# Patient Record
Sex: Female | Born: 1980 | Race: Black or African American | Hispanic: No | Marital: Single | Smoking: Current every day smoker
Health system: Southern US, Community
[De-identification: ages and names within clinical notes are randomized; demographics above are authoritative.]

## PROBLEM LIST (undated history)

## (undated) DIAGNOSIS — N879 Dysplasia of cervix uteri, unspecified: Secondary | ICD-10-CM

## (undated) HISTORY — DX: Dysplasia of cervix uteri, unspecified: N87.9

## (undated) HISTORY — PX: BREAST SURGERY: SHX581

## (undated) HISTORY — PX: COLPOSCOPY: SHX161

---

## 2003-11-12 ENCOUNTER — Other Ambulatory Visit: Admission: RE | Admit: 2003-11-12 | Discharge: 2003-11-12 | Payer: Self-pay | Admitting: *Deleted

## 2006-05-22 ENCOUNTER — Emergency Department (HOSPITAL_COMMUNITY): Admission: EM | Admit: 2006-05-22 | Discharge: 2006-05-22 | Payer: Self-pay | Admitting: Emergency Medicine

## 2006-05-27 ENCOUNTER — Inpatient Hospital Stay (HOSPITAL_COMMUNITY): Admission: AD | Admit: 2006-05-27 | Discharge: 2006-05-27 | Payer: Self-pay | Admitting: Gynecology

## 2006-08-16 ENCOUNTER — Emergency Department (HOSPITAL_COMMUNITY): Admission: EM | Admit: 2006-08-16 | Discharge: 2006-08-16 | Payer: Self-pay | Admitting: Emergency Medicine

## 2007-08-16 ENCOUNTER — Other Ambulatory Visit: Admission: RE | Admit: 2007-08-16 | Discharge: 2007-08-16 | Payer: Self-pay | Admitting: Gynecology

## 2010-10-11 ENCOUNTER — Ambulatory Visit (INDEPENDENT_AMBULATORY_CARE_PROVIDER_SITE_OTHER): Payer: BC Managed Care – PPO | Admitting: Gynecology

## 2010-10-11 ENCOUNTER — Other Ambulatory Visit: Payer: Self-pay | Admitting: Gynecology

## 2010-10-11 ENCOUNTER — Other Ambulatory Visit (HOSPITAL_COMMUNITY)
Admission: RE | Admit: 2010-10-11 | Discharge: 2010-10-11 | Disposition: A | Discharge status: Home / Self Care | Payer: BC Managed Care – PPO | Source: Ambulatory Visit | Attending: Gynecology | Admitting: Gynecology

## 2010-10-11 DIAGNOSIS — B373 Candidiasis of vulva and vagina: Secondary | ICD-10-CM

## 2010-10-11 DIAGNOSIS — Z113 Encounter for screening for infections with a predominantly sexual mode of transmission: Secondary | ICD-10-CM

## 2010-10-11 DIAGNOSIS — Z833 Family history of diabetes mellitus: Secondary | ICD-10-CM

## 2010-10-11 DIAGNOSIS — Z01419 Encounter for gynecological examination (general) (routine) without abnormal findings: Secondary | ICD-10-CM

## 2010-10-11 DIAGNOSIS — Z13220 Encounter for screening for lipoid disorders: Secondary | ICD-10-CM

## 2010-10-11 DIAGNOSIS — Z124 Encounter for screening for malignant neoplasm of cervix: Secondary | ICD-10-CM | POA: Insufficient documentation

## 2011-07-04 ENCOUNTER — Encounter: Payer: Self-pay | Admitting: Gynecology

## 2011-07-06 ENCOUNTER — Ambulatory Visit: Payer: BC Managed Care – PPO | Admitting: Gynecology

## 2011-07-07 ENCOUNTER — Ambulatory Visit (INDEPENDENT_AMBULATORY_CARE_PROVIDER_SITE_OTHER): Payer: BC Managed Care – PPO | Admitting: Gynecology

## 2011-07-07 ENCOUNTER — Encounter: Payer: Self-pay | Admitting: Gynecology

## 2011-07-07 DIAGNOSIS — N912 Amenorrhea, unspecified: Secondary | ICD-10-CM

## 2011-07-07 DIAGNOSIS — Z113 Encounter for screening for infections with a predominantly sexual mode of transmission: Secondary | ICD-10-CM

## 2011-07-07 DIAGNOSIS — F17200 Nicotine dependence, unspecified, uncomplicated: Secondary | ICD-10-CM

## 2011-07-07 DIAGNOSIS — Z720 Tobacco use: Secondary | ICD-10-CM

## 2011-07-07 DIAGNOSIS — N9089 Other specified noninflammatory disorders of vulva and perineum: Secondary | ICD-10-CM

## 2011-07-07 NOTE — Progress Notes (Signed)
Patient presents with several issues. 1. Partner unfaithful. Wants STD screening. No known exposure. 2. Prescription for Chantix to help stop smoking. She had used before with success but unfortunate started smoking again. 3. Skipped menses. She missed her menses in January although was being treated for the flu. She is on low-dose oral contraceptives and has not missed any has continued with them. 4. Small bump on her vulva/perianal area.  It is new in onset and seems to be getting bigger.  Exam was Sherrilyn Rist chaperone present External with pigmented raised area 9:00 position lateral to the anus with underlying palpable area suspicious for sebaceous cyst. BUS vagina normal. Cervix normal. Uterus normal size shape contour midline mobile nontender. Adnexa without masses or tenderness.  Assessment and plan 1. STD screening. GC chlamydia hepatitis B hepatitis C HIV and RPR done. Patient will follow for results. 2. Prescription for Chantix given a starter doses reviewed. Literature was provided to reinforce this. Suicide ideation and other side effect profile reviewed. 3. Skipped menses. UPT is negative. Exam normal. Patient will continue on his pack assuming resumed menses at the end of this pack will follow. If continued skips them will represent for evaluation. 4. Perianal lesion. Suspect sebaceous cyst although with pigmented overlying skin recommended excision. Patient agreed. Area cleansed with Betadine, infiltrated with 1% lidocaine and using an elliptical incision the pigmented overlying skin was excised and an underlying sebaceous cyst entered. The sebaceous material was evacuated and the cyst wall was excised in its entirety and sent to pathology. Silver nitrate was applied to the base for hemostasis. Postoperative instructions were given. Patient will follow up for pathology results. Patient is due for her annual in May and I reminded her to schedule this assuming she resumed normal menses and she'll  follow up then sooner as needed.

## 2011-07-07 NOTE — Patient Instructions (Signed)
Follow up for pathology results from biopsy. Start Chantix as discussed. Make appointment for annual exam in May. Follow up sooner if irregular menses continue.

## 2011-07-08 LAB — HEPATITIS B SURFACE ANTIGEN: Hepatitis B Surface Ag: NEGATIVE

## 2011-07-08 LAB — GC/CHLAMYDIA PROBE AMP, GENITAL: Chlamydia, DNA Probe: NEGATIVE

## 2011-07-08 LAB — RPR

## 2011-10-16 ENCOUNTER — Other Ambulatory Visit: Payer: Self-pay | Admitting: Gynecology

## 2011-10-16 NOTE — Telephone Encounter (Signed)
Pt has annual schedule for June 7. rx sent no refills

## 2011-11-10 ENCOUNTER — Ambulatory Visit (INDEPENDENT_AMBULATORY_CARE_PROVIDER_SITE_OTHER): Payer: BC Managed Care – PPO | Admitting: Gynecology

## 2011-11-10 ENCOUNTER — Encounter: Payer: Self-pay | Admitting: Gynecology

## 2011-11-10 VITALS — BP 116/74 | Ht 64.0 in | Wt 132.0 lb

## 2011-11-10 DIAGNOSIS — N879 Dysplasia of cervix uteri, unspecified: Secondary | ICD-10-CM | POA: Insufficient documentation

## 2011-11-10 DIAGNOSIS — Z01419 Encounter for gynecological examination (general) (routine) without abnormal findings: Secondary | ICD-10-CM

## 2011-11-10 MED ORDER — NORETHINDRONE ACET-ETHINYL EST 1-20 MG-MCG PO TABS: 1.0000 | ORAL_TABLET | Freq: Every day | ORAL | Status: DC

## 2011-11-10 NOTE — Patient Instructions (Signed)
Follow up in one year for annual gynecologic exam. Consider Mirena IUD.  Consider Stop Smoking.  Help is available at [facility] smoking cessation program @ www.[place].com or 2497198231. OR 1-800-QUIT-NOW 217 487 2855) for free smoking cessation counseling.   Smoking Hazards Smoking cigarettes is extremely bad for your health. Tobacco smoke has over 200 known poisons in it. There are over 60 chemicals in tobacco smoke that cause cancer. Some of the chemicals found in cigarette smoke include:  Cyanide.  Benzene.  Formaldehyde.  Methanol (wood alcohol).  Acetylene (fuel used in welding torches).  Ammonia.  Cigarette smoke also contains the poisonous gases nitrogen oxide and carbon monoxide.  Cigarette smokers have an increased risk of many serious medical problems, including: Lung cancer.  Lung disease (such as pneumonia, bronchitis, and emphysema).  Heart attack and chest pain due to the heart not getting enough oxygen (angina).  Heart disease and peripheral blood vessel disease.  Hypertension.  Stroke.  Oral cancer (cancer of the lip, mouth, or voice box).  Bladder cancer.  Pancreatic cancer.  Cervical cancer.  Pregnancy complications, including premature birth.  Low birthweight babies.  Early menopause.  Lower estrogen level for women.  Infertility.  Facial wrinkles.  Blindness.  Increased risk of broken bones (fractures).  Senile dementia.  Stillbirths and smaller newborn babies, birth defects, and genetic damage to sperm.  Stomach ulcers and internal bleeding.  Children of smokers have an increased risk of the following, because of secondhand smoke exposure:  Sudden infant death syndrome (SIDS).  Respiratory infections.  Lung cancer.  Heart disease.  Ear infections.  Smoking causes approximately: 90% of all lung cancer deaths in men.  80% of all lung cancer deaths in women.  90% of deaths from chronic obstructive lung disease.  Compared with  nonsmokers, smoking increases the risk of: Coronary heart disease by 2 to 4 times.  Stroke by 2 to 4 times.  Men developing lung cancer by 23 times.  Women developing lung cancer by 13 times.  Dying from chronic obstructive lung diseases by 12 times.  Someone who smokes 2 packs a day loses about 8 years of his or her life. Even smoking lightly shortens your life expectancy by several years. You can greatly reduce the risk of medical problems for you and your family by stopping now. Smoking is the most preventable cause of death and disease in our society. Within days of quitting smoking, your circulation returns to normal, you decrease the risk of having a heart attack, and your lung capacity improves. There may be some increased phlegm in the first few days after quitting, and it may take months for your lungs to clear up completely. Quitting for 10 years cuts your lung cancer risk to almost that of a nonsmoker. WHY IS SMOKING ADDICTIVE? Nicotine is the chemical agent in tobacco that is capable of causing addiction or dependence.  When you smoke and inhale, nicotine is absorbed rapidly into the bloodstream through your lungs. Nicotine absorbed through the lungs is capable of creating a powerful addiction. Both inhaled and non-inhaled nicotine may be addictive.  Addiction studies of cigarettes and spit tobacco show that addiction to nicotine occurs mainly during the teen years, when young people begin using tobacco products.  WHAT ARE THE BENEFITS OF QUITTING?  There are many health benefits to quitting smoking.  Likelihood of developing cancer and heart disease decreases. Health improvements are seen almost immediately.  Blood pressure, pulse rate, and breathing patterns start returning to normal soon after  quitting.  People who quit may see an improvement in their overall quality of life.  Some people choose to quit all at once. Other options include nicotine replacement products, such as patches,  gum, and nasal sprays. Do not use these products without first checking with your caregiver. QUITTING SMOKING It is not easy to quit smoking. Nicotine is addicting, and longtime habits are hard to change. To start, you can write down all your reasons for quitting, tell your family and friends you want to quit, and ask for their help. Throw your cigarettes away, chew gum or cinnamon sticks, keep your hands busy, and drink extra water or juice. Go for walks and practice deep breathing to relax. Think of all the money you are saving: around $1,000 a year, for the average pack-a-day smoker. Nicotine patches and gum have been shown to improve success at efforts to stop smoking. Zyban (bupropion) is an anti-depressant drug that can be prescribed to reduce nicotine withdrawal symptoms and to suppress the urge to smoke. Smoking is an addiction with both physical and psychological effects. Joining a stop-smoking support group can help you cope with the emotional issues. For more information and advice on programs to stop smoking, call your doctor, your local hospital, or these organizations: American Lung Association - 1-800-LUNGUSA  American Cancer Society - 1-800-ACS-2345  Document Released: 06/29/2004 Document Revised: 02/01/2011 Document Reviewed: 03/03/2009 [facility] Patient Information 2012 [place], [place].  Smoking Cessation This document explains the best ways for you to quit smoking and new treatments to help. It lists new medicines that can double or triple your chances of quitting and quitting for good. It also considers ways to avoid relapses and concerns you may have about quitting, including weight gain. NICOTINE: A POWERFUL ADDICTION If you have tried to quit smoking, you know how hard it can be. It is hard because nicotine is a very addictive drug. For some people, it can be as addictive as heroin or cocaine. Usually, people make 2 or 3 tries, or more, before finally being able to quit. Each time  you try to quit, you can learn about what helps and what hurts. Quitting takes hard work and a lot of effort, but you can quit smoking. QUITTING SMOKING IS ONE OF THE MOST IMPORTANT THINGS YOU WILL EVER DO.  You will live longer, feel better, and live better.   The impact on your body of quitting smoking is felt almost immediately:   Within 20 minutes, blood pressure decreases. Pulse returns to its normal level.   After 8 hours, carbon monoxide levels in the blood return to normal. Oxygen level increases.   After 24 hours, chance of heart attack starts to decrease. Breath, hair, and body stop smelling like smoke.   After 48 hours, damaged nerve endings begin to recover. Sense of taste and smell improve.   After 72 hours, the body is virtually free of nicotine. Bronchial tubes relax and breathing becomes easier.   After 2 to 12 weeks, lungs can hold more air. Exercise becomes easier and circulation improves.   Quitting will reduce your risk of having a heart attack, stroke, cancer, or lung disease:   After 1 year, the risk of coronary heart disease is cut in half.   After 5 years, the risk of stroke falls to the same as a nonsmoker.   After 10 years, the risk of lung cancer is cut in half and the risk of other cancers decreases significantly.   After 15 years, the  risk of coronary heart disease drops, usually to the level of a nonsmoker.   If you are pregnant, quitting smoking will improve your chances of having a healthy baby.   The people you live with, especially your children, will be healthier.   You will have extra money to spend on things other than cigarettes.  FIVE KEYS TO QUITTING Studies have shown that these 5 steps will help you quit smoking and quit for good. You have the best chances of quitting if you use them together: 1. Get ready.  2. Get support and encouragement.  3. Learn new skills and behaviors.  4. Get medicine to reduce your nicotine addiction and use  it correctly.  5. Be prepared for relapse or difficult situations. Be determined to continue trying to quit, even if you do not succeed at first.  1. GET READY  Set a quit date.   Change your environment.   Get rid of ALL cigarettes, ashtrays, matches, and lighters in your home, car, and place of work.   Do not let people smoke in your home.   Review your past attempts to quit. Think about what worked and what did not.   Once you quit, do not smoke. NOT EVEN A PUFF!  2. GET SUPPORT AND ENCOURAGEMENT Studies have shown that you have a better chance of being successful if you have help. You can get support in many ways.  Tell your family, friends, and coworkers that you are going to quit and need their support. Ask them not to smoke around you.   Talk to your caregivers (doctor, dentist, nurse, pharmacist, psychologist, and/or smoking counselor).   Get individual, group, or telephone counseling and support. The more counseling you have, the better your chances are of quitting. Programs are available at [employer] and health centers. Call your local health department for information about programs in your area.   Spiritual beliefs and practices may help some smokers quit.   Quit meters are Photographer that keep track of quit statistics, such as amount of "quit-time," cigarettes not smoked, and money saved.   Many smokers find one or more of the many self-help books available useful in helping them quit and stay off tobacco.  3. LEARN NEW SKILLS AND BEHAVIORS  Try to distract yourself from urges to smoke. Talk to someone, go for a walk, or occupy your time with a task.   When you first try to quit, change your routine. Take a different route to work. Drink tea instead of coffee. Eat breakfast in a different place.   Do something to reduce your stress. Take a hot bath, exercise, or read a book.   Plan something enjoyable to do every day. Reward  yourself for not smoking.   Explore interactive web-based programs that specialize in helping you quit.  4. GET MEDICINE AND USE IT CORRECTLY Medicines can help you stop smoking and decrease the urge to smoke. Combining medicine with the above behavioral methods and support can quadruple your chances of successfully quitting smoking. The U.S. Food and Drug Administration (FDA) has approved 7 medicines to help you quit smoking. These medicines fall into 3 categories.  Nicotine replacement therapy (delivers nicotine to your body without the negative effects and risks of smoking):   Nicotine gum: Available over-the-counter.   Nicotine lozenges: Available over-the-counter.   Nicotine inhaler: Available by prescription.   Nicotine nasal spray: Available by prescription.   Nicotine skin patches (transdermal): Available by  prescription and over-the-counter.   Antidepressant medicine (helps people abstain from smoking, but how this works is unknown):   Bupropion sustained-release (SR) tablets: Available by prescription.   Nicotinic receptor partial agonist (simulates the effect of nicotine in your brain):   Varenicline tartrate tablets: Available by prescription.   Ask your caregiver for advice about which medicines to use and how to use them. Carefully read the information on the package.   Everyone who is trying to quit may benefit from using a medicine. If you are pregnant or trying to become pregnant, nursing an infant, you are under age 77, or you smoke fewer than 10 cigarettes per day, talk to your caregiver before taking any nicotine replacement medicines.   You should stop using a nicotine replacement product and call your caregiver if you experience nausea, dizziness, weakness, vomiting, fast or irregular heartbeat, mouth problems with the lozenge or gum, or redness or swelling of the skin around the patch that does not go away.   Do not use any other product containing nicotine  while using a nicotine replacement product.   Talk to your caregiver before using these products if you have diabetes, heart disease, asthma, stomach ulcers, you had a recent heart attack, you have high blood pressure that is not controlled with medicine, a history of irregular heartbeat, or you have been prescribed medicine to help you quit smoking.  5. BE PREPARED FOR RELAPSE OR DIFFICULT SITUATIONS  Most relapses occur within the first 3 months after quitting. Do not be discouraged if you start smoking again. Remember, most people try several times before they finally quit.   You may have symptoms of withdrawal because your body is used to nicotine. You may crave cigarettes, be irritable, feel very hungry, cough often, get headaches, or have difficulty concentrating.   The withdrawal symptoms are only temporary. They are strongest when you first quit, but they will go away within 10 to 14 days.  Here are some difficult situations to watch for:  Alcohol. Avoid drinking alcohol. Drinking lowers your chances of successfully quitting.   Caffeine. Try to reduce the amount of caffeine you consume. It also lowers your chances of successfully quitting.   Other smokers. Being around smoking can make you want to smoke. Avoid smokers.   Weight gain. Many smokers will gain weight when they quit, usually less than 10 pounds. Eat a healthy diet and stay active. Do not let weight gain distract you from your main goal, quitting smoking. Some medicines that help you quit smoking may also help delay weight gain. You can always lose the weight gained after you quit.   Bad mood or depression. There are a lot of ways to improve your mood other than smoking.  If you are having problems with any of these situations, talk to your caregiver. SPECIAL SITUATIONS AND CONDITIONS Studies suggest that everyone can quit smoking. Your situation or condition can give you a special reason to quit.  Pregnant women/new  mothers: By quitting, you protect your baby's health and your own.   Hospitalized patients: By quitting, you reduce health problems and help healing.   Heart attack patients: By quitting, you reduce your risk of a second heart attack.   Lung, head, and neck cancer patients: By quitting, you reduce your chance of a second cancer.   Parents of children and adolescents: By quitting, you protect your children from illnesses caused by secondhand smoke.  QUESTIONS TO THINK ABOUT Think about the following questions before  you try to stop smoking. You may want to talk about your answers with your caregiver.  Why do you want to quit?   If you tried to quit in the past, what helped and what did not?   What will be the most difficult situations for you after you quit? How will you plan to handle them?   Who can help you through the tough times? Your family? Friends? Caregiver?   What pleasures do you get from smoking? What ways can you still get pleasure if you quit?  Here are some questions to ask your caregiver:  How can you help me to be successful at quitting?   What medicine do you think would be best for me and how should I take it?   What should I do if I need more help?   What is smoking withdrawal like? How can I get information on withdrawal?  Quitting takes hard work and a lot of effort, but you can quit smoking. FOR MORE INFORMATION  Smokefree.gov (http://www.[place].com/) provides free, accurate, evidence-based information and professional assistance to help support the immediate and long-term needs of people trying to quit smoking. Document Released: 05/16/2001 Document Revised: 02/01/2011 Document Reviewed: 03/08/2009 [employer] Patient Information 2012 [place], [place].

## 2011-11-10 NOTE — Progress Notes (Signed)
Angelica Dillon [birth year] [id number]        31 y.o.  for annual exam.    Past medical history,surgical history, medications, allergies, family history and social history were all reviewed and documented in the EPIC chart. ROS:  Was performed and pertinent positives and negatives are included in the history.  Exam: Kim chaperone present Filed Vitals:   11/10/11 0852  BP: 116/74   General appearance  Normal Skin grossly normal Head/Neck normal with no cervical or supraclavicular adenopathy thyroid normal Lungs  clear Cardiac RR, without RMG Abdominal  soft, nontender, without masses, organomegaly or hernia Breasts  examined lying and sitting without masses, retractions, discharge or axillary adenopathy. Pelvic  Ext/BUS/vagina  normal   Cervix  normal   Uterus  anteverted, normal size, shape and contour, midline and mobile nontender   Adnexa  Without masses or tenderness    Anus and perineum  normal   Rectovaginal  normal sphincter tone without palpated masses or tenderness.    Assessment/Plan:  31 y.o. female for annual exam.    1. History dysplasia. Patient is when she was 15 she had an abnormal Pap smear but they did colposcopy and then followed her with repeat Pap smears which were normal afterwards. She did not receive any treatment. Her Pap smears all been normal since then. Last Pap smear 2012. I did not do a Pap smear today. I reviewed current screening guidelines we'll plan less frequent screening every 3-5 years. 2. Contraception. She is on low-dose oral contraceptives doing well with scant menses. I again reviewed her smoking and the risk of thrombosis to include stroke heart attack DVT. If she turns 33 she'll need alternatives and I again strongly urged her to consider a Mirena IUD. I refilled her Loestrin 120 equivalence at her request as she accepted the risks. 3. Stop smoking. Again reviewed stop smoking. She tried Chantix but did not like the side effects. I reviewed  alternative strategies and strongly encouraged her to stop smoking. 4. Anxiety. Patient does note some anxiety primarily with interaction with others. I reviewed normal life stressors versus abnormal such as anxiety disorder. Sounds like she is having more of a normal response to light stressors. Options to include behavior modification versus medication reviewed. Patient's not interested in medication at this point although she may call me if she desires to try medication such as fluoxetine. 5. Health maintenance. SBE monthly reviewed.  No blood work was done today. She had a normal glucose, lipid profile, CBC in 2012. Assuming she continues well from a gynecologic standpoint she will see me in a year, sooner as needed.    Angelica Lords MD, 9:29 AM 11/10/2011

## 2011-11-11 LAB — URINALYSIS W MICROSCOPIC + REFLEX CULTURE
Bacteria, UA: NONE SEEN
Glucose, UA: NEGATIVE mg/dL
Hgb urine dipstick: NEGATIVE
Ketones, ur: NEGATIVE mg/dL
Protein, ur: NEGATIVE mg/dL
Specific Gravity, Urine: 1.021 (ref 1.005–1.030)
Urobilinogen, UA: 0.2 mg/dL (ref 0.0–1.0)
pH: 6 (ref 5.0–8.0)

## 2012-02-06 ENCOUNTER — Telehealth: Payer: Self-pay | Admitting: *Deleted

## 2012-02-06 MED ORDER — FLUCONAZOLE 150 MG PO TABS: 150.0000 mg | ORAL_TABLET | Freq: Once | ORAL | Status: AC

## 2012-02-06 NOTE — Telephone Encounter (Signed)
Pt called asking if she could try wellbutrin to help with her smoking, she tried chantix but it made her sick. She asked if you would like OV or could you prescribe medication? She also had root canal done and now has a mild yeast infection. Itching, slight white discharge, she is requesting 1 dose of diflucan. Pt unable to make OV at this time due to work. Please advise

## 2012-02-06 NOTE — Telephone Encounter (Signed)
Diflucan 150 mg x1. Office visit if she wants to discuss Wellbutrin.

## 2012-02-06 NOTE — Telephone Encounter (Signed)
Pt informed with the below note. She will call back to make ov to discuss.

## 2012-06-20 ENCOUNTER — Encounter: Payer: Self-pay | Admitting: Gynecology

## 2012-06-20 ENCOUNTER — Ambulatory Visit (INDEPENDENT_AMBULATORY_CARE_PROVIDER_SITE_OTHER): Payer: BC Managed Care – PPO | Admitting: Gynecology

## 2012-06-20 DIAGNOSIS — A499 Bacterial infection, unspecified: Secondary | ICD-10-CM

## 2012-06-20 DIAGNOSIS — N898 Other specified noninflammatory disorders of vagina: Secondary | ICD-10-CM

## 2012-06-20 DIAGNOSIS — B9689 Other specified bacterial agents as the cause of diseases classified elsewhere: Secondary | ICD-10-CM

## 2012-06-20 DIAGNOSIS — N760 Acute vaginitis: Secondary | ICD-10-CM

## 2012-06-20 LAB — WET PREP FOR TRICH, YEAST, CLUE
Trich, Wet Prep: NONE SEEN
Yeast Wet Prep HPF POC: NONE SEEN

## 2012-06-20 MED ORDER — METRONIDAZOLE 500 MG PO TABS: 500.0000 mg | ORAL_TABLET | Freq: Two times a day (BID) | ORAL | Status: DC

## 2012-06-20 NOTE — Progress Notes (Signed)
Patient presents complaining of vaginal discharge with odor. Started before her last menses and then resumed after the menses. No itching or urinary symptoms such as frequency dysuria or urgency.  Exam was kim assistant Pelvic external BUS vagina with abundant frothy white discharge. Cervix normal. Uterus normal size midline mobile nontender. Adnexa without masses or tenderness.  Assessment and plan: Exam, history and wet prep consistent with bacterial vaginosis.  Options for treatment reviewed. Flagyl 500 mg twice a day x7 days prescribed avoidance stressed.

## 2012-06-20 NOTE — Patient Instructions (Signed)
Take antibiotic twice daily for 7 days.  Avoid alcohol while taking. 

## 2012-11-15 ENCOUNTER — Encounter: Payer: BC Managed Care – PPO | Admitting: Gynecology

## 2012-11-22 ENCOUNTER — Encounter: Payer: Self-pay | Admitting: Gynecology

## 2012-11-22 ENCOUNTER — Ambulatory Visit (INDEPENDENT_AMBULATORY_CARE_PROVIDER_SITE_OTHER): Payer: BC Managed Care – PPO | Admitting: Gynecology

## 2012-11-22 VITALS — BP 120/74 | Ht 64.0 in | Wt 125.0 lb

## 2012-11-22 DIAGNOSIS — Z01419 Encounter for gynecological examination (general) (routine) without abnormal findings: Secondary | ICD-10-CM

## 2012-11-22 DIAGNOSIS — Z13220 Encounter for screening for lipoid disorders: Secondary | ICD-10-CM

## 2012-11-22 DIAGNOSIS — Z720 Tobacco use: Secondary | ICD-10-CM

## 2012-11-22 DIAGNOSIS — F17200 Nicotine dependence, unspecified, uncomplicated: Secondary | ICD-10-CM

## 2012-11-22 LAB — URINALYSIS W MICROSCOPIC + REFLEX CULTURE
Bacteria, UA: NONE SEEN
Bilirubin Urine: NEGATIVE
Casts: NONE SEEN
Crystals: NONE SEEN
Glucose, UA: NEGATIVE mg/dL
Protein, ur: NEGATIVE mg/dL
Specific Gravity, Urine: 1.02 (ref 1.005–1.030)
Squamous Epithelial / LPF: NONE SEEN
Urobilinogen, UA: 0.2 mg/dL (ref 0.0–1.0)
pH: 7 (ref 5.0–8.0)

## 2012-11-22 LAB — CBC WITH DIFFERENTIAL/PLATELET
Basophils Relative: 0 % (ref 0–1)
Eosinophils Absolute: 0 10*3/uL (ref 0.0–0.7)
Hemoglobin: 12.3 g/dL (ref 12.0–15.0)
Lymphs Abs: 2.6 10*3/uL (ref 0.7–4.0)
MCHC: 33 g/dL (ref 30.0–36.0)
MCV: 68.1 fL — ABNORMAL LOW (ref 78.0–100.0)
Monocytes Absolute: 0.6 10*3/uL (ref 0.1–1.0)
Monocytes Relative: 8 % (ref 3–12)
Neutrophils Relative %: 55 % (ref 43–77)
RBC: 5.48 MIL/uL — ABNORMAL HIGH (ref 3.87–5.11)

## 2012-11-22 LAB — COMPREHENSIVE METABOLIC PANEL
AST: 15 U/L (ref 0–37)
BUN: 11 mg/dL (ref 6–23)
CO2: 24 mEq/L (ref 19–32)
Calcium: 9.4 mg/dL (ref 8.4–10.5)
Chloride: 104 mEq/L (ref 96–112)
Potassium: 4.2 mEq/L (ref 3.5–5.3)
Sodium: 136 mEq/L (ref 135–145)
Total Bilirubin: 0.6 mg/dL (ref 0.3–1.2)

## 2012-11-22 LAB — LIPID PANEL: Triglycerides: 67 mg/dL (ref <150)

## 2012-11-22 NOTE — Progress Notes (Signed)
Angelica Dillon [birth year] [id number]        32 y.o.  G1P0010 for annual exam.  Several issues noted below.  Past medical history,surgical history, medications, allergies, family history and social history were all reviewed and documented in the EPIC chart.  ROS:  Performed and pertinent positives and negatives are included in the history, assessment and plan .  Exam: Kim assistant Filed Vitals:   11/22/12 0844  BP: 120/74  Height: 5\' 4"  (1.626 m)  Weight: 125 lb (56.7 kg)   General appearance  Normal Skin grossly normal Head/Neck normal with no cervical or supraclavicular adenopathy thyroid normal Lungs  clear Cardiac RR, without RMG Abdominal  soft, nontender, without masses, organomegaly or hernia Breasts  examined lying and sitting without masses, retractions, discharge or axillary adenopathy. Pelvic  Ext/BUS/vagina  normal   Cervix  normal   Uterus  anteverted, normal size, shape and contour, midline and mobile nontender   Adnexa  Without masses or tenderness    Anus and perineum  normal   Rectovaginal  normal sphincter tone without palpated masses or tenderness.    Assessment/Plan:  32 y.o. G65P0010 female for annual exam, regular menses, abstinence birth control.   1. Contraception. Patient had been on low-dose oral contraceptives but stopped them. She's currently not sexually active. I reviewed options with her and again encouraged her to consider IUD given her smoking history. Patient will followup for contraceptive discussion at her choice but currently does not want to consider anything. 2. Stop smoking. Patient requested Chantix. Has used previously without problems and was successful but unfortunately started again later. I gave her prescription for the starter month and 2 subsequent continuation months. Side effect profile to include suicide ideation discussed. 3. Breast health. SBE monthly review. Plan mammogram closer to 38. 4. Pap smear 2012. No Pap smear done  today. History of abnormal Pap smear age 45 with no treatment and normal Pap smears since. Plan repeat next year 3 year interval. 5. Health maintenance. Baseline CBC comprehensive metabolic panel lipid profile urinalysis ordered. Followup one year, sooner as needed.    [name] MD, 9:09 AM 11/22/2012

## 2012-11-22 NOTE — Patient Instructions (Signed)
Followup in one year for annual exam.  Consider Stop Smoking.  Help is available at [facility]'s smoking cessation program @ www.[facility].com or 336-832-0838. OR 1-800-QUIT-NOW (1-800-784-8669) for free smoking cessation counseling.  Smokefree.gov (http://www.smokefree.gov) provides free, accurate, evidence-based information and professional assistance to help support the immediate and long-term needs of people trying to quit smoking.    Smoking Hazards Smoking cigarettes is extremely bad for your health. Tobacco smoke has over 200 known poisons in it. There are over 60 chemicals in tobacco smoke that cause cancer. Some of the chemicals found in cigarette smoke include:  Cyanide.  Benzene.  Formaldehyde.  Methanol (wood alcohol).  Acetylene (fuel used in welding torches).  Ammonia.  Cigarette smoke also contains the poisonous gases nitrogen oxide and carbon monoxide.  Cigarette smokers have an increased risk of many serious medical problems, including: Lung cancer.  Lung disease (such as pneumonia, bronchitis, and emphysema).  Heart attack and chest pain due to the heart not getting enough oxygen (angina).  Heart disease and peripheral blood vessel disease.  Hypertension.  Stroke.  Oral cancer (cancer of the lip, mouth, or voice box).  Bladder cancer.  Pancreatic cancer.  Cervical cancer.  Pregnancy complications, including premature birth.  Low birthweight babies.  Early menopause.  Lower estrogen level for women.  Infertility.  Facial wrinkles.  Blindness.  Increased risk of broken bones (fractures).  Senile dementia.  Stillbirths and smaller newborn babies, birth defects, and genetic damage to sperm.  Stomach ulcers and internal bleeding.  Children of smokers have an increased risk of the following, because of secondhand smoke exposure:  Sudden infant death syndrome (SIDS).  Respiratory infections.  Lung cancer.  Heart disease.  Ear infections.  Smoking causes  approximately: 90% of all lung cancer deaths in men.  80% of all lung cancer deaths in women.  90% of deaths from chronic obstructive lung disease.  Compared with nonsmokers, smoking increases the risk of: Coronary heart disease by 2 to 4 times.  Stroke by 2 to 4 times.  Men developing lung cancer by 23 times.  Women developing lung cancer by 13 times.  Dying from chronic obstructive lung diseases by 12 times.  Someone who smokes 2 packs a day loses about 8 years of his or her life. Even smoking lightly shortens your life expectancy by several years. You can greatly reduce the risk of medical problems for you and your family by stopping now. Smoking is the most preventable cause of death and disease in our society. Within days of quitting smoking, your circulation returns to normal, you decrease the risk of having a heart attack, and your lung capacity improves. There may be some increased phlegm in the first few days after quitting, and it may take months for your lungs to clear up completely. Quitting for 10 years cuts your lung cancer risk to almost that of a nonsmoker. WHY IS SMOKING ADDICTIVE? Nicotine is the chemical agent in tobacco that is capable of causing addiction or dependence.  When you smoke and inhale, nicotine is absorbed rapidly into the bloodstream through your lungs. Nicotine absorbed through the lungs is capable of creating a powerful addiction. Both inhaled and non-inhaled nicotine may be addictive.  Addiction studies of cigarettes and spit tobacco show that addiction to nicotine occurs mainly during the teen years, when young people begin using tobacco products.  WHAT ARE THE BENEFITS OF QUITTING?  There are many health benefits to quitting smoking.  Likelihood of developing cancer and heart disease   decreases. Health improvements are seen almost immediately.  Blood pressure, pulse rate, and breathing patterns start returning to normal soon after quitting.  People who quit  may see an improvement in their overall quality of life.  Some people choose to quit all at once. Other options include nicotine replacement products, such as patches, gum, and nasal sprays. Do not use these products without first checking with your caregiver. QUITTING SMOKING It is not easy to quit smoking. Nicotine is addicting, and longtime habits are hard to change. To start, you can write down all your reasons for quitting, tell your family and friends you want to quit, and ask for their help. Throw your cigarettes away, chew gum or cinnamon sticks, keep your hands busy, and drink extra water or juice. Go for walks and practice deep breathing to relax. Think of all the money you are saving: around $1,000 a year, for the average pack-a-day smoker. Nicotine patches and gum have been shown to improve success at efforts to stop smoking. Zyban (bupropion) is an anti-depressant drug that can be prescribed to reduce nicotine withdrawal symptoms and to suppress the urge to smoke. Smoking is an addiction with both physical and psychological effects. Joining a stop-smoking support group can help you cope with the emotional issues. For more information and advice on programs to stop smoking, call your doctor, your local hospital, or these organizations: American Lung Association - 1-800-LUNGUSA   Smoking Cessation  This document explains the best ways for you to quit smoking and new treatments to help. It lists new medicines that can double or triple your chances of quitting and quitting for good. It also considers ways to avoid relapses and concerns you may have about quitting, including weight gain. NICOTINE: A POWERFUL ADDICTION If you have tried to quit smoking, you know how hard it can be. It is hard because nicotine is a very addictive drug. For some people, it can be as addictive as heroin or cocaine. Usually, people make 2 or 3 tries, or more, before finally being able to quit. Each time you try to  quit, you can learn about what helps and what hurts. Quitting takes hard work and a lot of effort, but you can quit smoking. QUITTING SMOKING IS ONE OF THE MOST IMPORTANT THINGS YOU WILL EVER DO.  You will live longer, feel better, and live better.   The impact on your body of quitting smoking is felt almost immediately:   Within 20 minutes, blood pressure decreases. Pulse returns to its normal level.   After 8 hours, carbon monoxide levels in the blood return to normal. Oxygen level increases.   After 24 hours, chance of heart attack starts to decrease. Breath, hair, and body stop smelling like smoke.   After 48 hours, damaged nerve endings begin to recover. Sense of taste and smell improve.   After 72 hours, the body is virtually free of nicotine. Bronchial tubes relax and breathing becomes easier.   After 2 to 12 weeks, lungs can hold more air. Exercise becomes easier and circulation improves.   Quitting will reduce your risk of having a heart attack, stroke, cancer, or lung disease:   After 1 year, the risk of coronary heart disease is cut in half.   After 5 years, the risk of stroke falls to the same as a nonsmoker.   After 10 years, the risk of lung cancer is cut in half and the risk of other cancers decreases significantly.   After 15 years,   the risk of coronary heart disease drops, usually to the level of a nonsmoker.   If you are pregnant, quitting smoking will improve your chances of having a healthy baby.   The people you live with, especially your children, will be healthier.   You will have extra money to spend on things other than cigarettes.  FIVE KEYS TO QUITTING Studies have shown that these 5 steps will help you quit smoking and quit for good. You have the best chances of quitting if you use them together: 1. Get ready.  2. Get support and encouragement.  3. Learn new skills and behaviors.  4. Get medicine to reduce your nicotine addiction and use it  correctly.  5. Be prepared for relapse or difficult situations. Be determined to continue trying to quit, even if you do not succeed at first.  1. GET READY  Set a quit date.   Change your environment.   Get rid of ALL cigarettes, ashtrays, matches, and lighters in your home, car, and place of work.   Do not let people smoke in your home.   Review your past attempts to quit. Think about what worked and what did not.   Once you quit, do not smoke. NOT EVEN A PUFF!  2. GET SUPPORT AND ENCOURAGEMENT Studies have shown that you have a better chance of being successful if you have help. You can get support in many ways.  Tell your family, friends, and coworkers that you are going to quit and need their support. Ask them not to smoke around you.   Talk to your caregivers (doctor, dentist, nurse, pharmacist, psychologist, and/or smoking counselor).   Get individual, group, or telephone counseling and support. The more counseling you have, the better your chances are of quitting. Programs are available at local hospitals and health centers. Call your local health department for information about programs in your area.   Spiritual beliefs and practices may help some smokers quit.   Quit meters are small computer programs online or downloadable that keep track of quit statistics, such as amount of "quit-time," cigarettes not smoked, and money saved.   Many smokers find one or more of the many self-help books available useful in helping them quit and stay off tobacco.  3. LEARN NEW SKILLS AND BEHAVIORS  Try to distract yourself from urges to smoke. Talk to someone, go for a walk, or occupy your time with a task.   When you first try to quit, change your routine. Take a different route to work. Drink tea instead of coffee. Eat breakfast in a different place.   Do something to reduce your stress. Take a hot bath, exercise, or read a book.   Plan something enjoyable to do every day. Reward  yourself for not smoking.   Explore interactive web-based programs that specialize in helping you quit.  4. GET MEDICINE AND USE IT CORRECTLY Medicines can help you stop smoking and decrease the urge to smoke. Combining medicine with the above behavioral methods and support can quadruple your chances of successfully quitting smoking. The U.[place]. Food and Drug Administration (FDA) has approved 7 medicines to help you quit smoking. These medicines fall into 3 categories.  Nicotine replacement therapy (delivers nicotine to your body without the negative effects and risks of smoking):   Nicotine gum: Available over-the-counter.   Nicotine lozenges: Available over-the-counter.   Nicotine inhaler: Available by prescription.   Nicotine nasal spray: Available by prescription.   Nicotine skin patches (transdermal): Available   by prescription and over-the-counter.   Antidepressant medicine (helps people abstain from smoking, but how this works is unknown):   Bupropion sustained-release (SR) tablets: Available by prescription.   Nicotinic receptor partial agonist (simulates the effect of nicotine in your brain):   Varenicline tartrate tablets: Available by prescription.   Ask your caregiver for advice about which medicines to use and how to use them. Carefully read the information on the package.   Everyone who is trying to quit may benefit from using a medicine. If you are pregnant or trying to become pregnant, nursing an infant, you are under age 18, or you smoke fewer than 10 cigarettes per day, talk to your caregiver before taking any nicotine replacement medicines.   You should stop using a nicotine replacement product and call your caregiver if you experience nausea, dizziness, weakness, vomiting, fast or irregular heartbeat, mouth problems with the lozenge or gum, or redness or swelling of the skin around the patch that does not go away.   Do not use any other product containing nicotine  while using a nicotine replacement product.   Talk to your caregiver before using these products if you have diabetes, heart disease, asthma, stomach ulcers, you had a recent heart attack, you have high blood pressure that is not controlled with medicine, a history of irregular heartbeat, or you have been prescribed medicine to help you quit smoking.  5. BE PREPARED FOR RELAPSE OR DIFFICULT SITUATIONS  Most relapses occur within the first 3 months after quitting. Do not be discouraged if you start smoking again. Remember, most people try several times before they finally quit.   You may have symptoms of withdrawal because your body is used to nicotine. You may crave cigarettes, be irritable, feel very hungry, cough often, get headaches, or have difficulty concentrating.   The withdrawal symptoms are only temporary. They are strongest when you first quit, but they will go away within 10 to 14 days.  Here are some difficult situations to watch for:  Alcohol. Avoid drinking alcohol. Drinking lowers your chances of successfully quitting.   Caffeine. Try to reduce the amount of caffeine you consume. It also lowers your chances of successfully quitting.   Other smokers. Being around smoking can make you want to smoke. Avoid smokers.   Weight gain. Many smokers will gain weight when they quit, usually less than 10 pounds. Eat a healthy diet and stay active. Do not let weight gain distract you from your main goal, quitting smoking. Some medicines that help you quit smoking may also help delay weight gain. You can always lose the weight gained after you quit.   Bad mood or depression. There are a lot of ways to improve your mood other than smoking.  If you are having problems with any of these situations, talk to your caregiver. SPECIAL SITUATIONS AND CONDITIONS Studies suggest that everyone can quit smoking. Your situation or condition can give you a special reason to quit.  Pregnant women/new  mothers: By quitting, you protect your baby's health and your own.   Hospitalized patients: By quitting, you reduce health problems and help healing.   Heart attack patients: By quitting, you reduce your risk of a second heart attack.   Lung, head, and neck cancer patients: By quitting, you reduce your chance of a second cancer.   Parents of children and adolescents: By quitting, you protect your children from illnesses caused by secondhand smoke.  QUESTIONS TO THINK ABOUT Think about the following questions   before you try to stop smoking. You may want to talk about your answers with your caregiver.  Why do you want to quit?   If you tried to quit in the past, what helped and what did not?   What will be the most difficult situations for you after you quit? How will you plan to handle them?   Who can help you through the tough times? Your family? Friends? Caregiver?   What pleasures do you get from smoking? What ways can you still get pleasure if you quit?  Here are some questions to ask your caregiver:  How can you help me to be successful at quitting?   What medicine do you think would be best for me and how should I take it?   What should I do if I need more help?   What is smoking withdrawal like? How can I get information on withdrawal?  Quitting takes hard work and a lot of effort, but you can quit smoking.   

## 2012-11-26 ENCOUNTER — Other Ambulatory Visit: Payer: Self-pay | Admitting: *Deleted

## 2012-11-26 DIAGNOSIS — R7309 Other abnormal glucose: Secondary | ICD-10-CM

## 2012-11-27 ENCOUNTER — Other Ambulatory Visit: Payer: BC Managed Care – PPO

## 2012-11-27 DIAGNOSIS — R7309 Other abnormal glucose: Secondary | ICD-10-CM

## 2013-09-10 ENCOUNTER — Telehealth: Payer: Self-pay

## 2013-09-10 MED ORDER — PERMETHRIN 5 % EX CREA: TOPICAL_CREAM | CUTANEOUS | Status: DC

## 2013-09-10 NOTE — Telephone Encounter (Signed)
Rx sent. Patient instructed.  Patient just wanted me to mention to you that she had a reaction to hair dye a few weeks ago and had a bad reaction. She was prescribed prednisone and finished it abotu 5 days ago.  She just didn't want that to interfere with this medication.  I told her I could not imagine that would be a problem but I would pass the info on to you.

## 2013-09-10 NOTE — Telephone Encounter (Signed)
Patient reassured.  

## 2013-09-10 NOTE — Telephone Encounter (Signed)
Ok for Permethrin 5% cream, apply to entire body and wash off 12 hours later

## 2013-09-10 NOTE — Telephone Encounter (Signed)
Should be no problem 

## 2013-09-10 NOTE — Telephone Encounter (Signed)
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 Familiar with what scabies is and looks like. She started with rash on her arms on Friday.  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  She does not have PCP and is asking if you could call her in "the cream that you have to leave on for 24 hours to clear it up or do I need to come in and see Dr. Phineas Real?".  She is concerned because she is flying out tomorrow to [place].   What to rec to her?

## 2013-09-15 ENCOUNTER — Ambulatory Visit (INDEPENDENT_AMBULATORY_CARE_PROVIDER_SITE_OTHER): Payer: BC Managed Care – PPO | Admitting: Family Medicine

## 2013-09-15 VITALS — BP 104/76 | HR 82 | Temp 98.0°F | Resp 16 | Ht 63.0 in | Wt 127.0 lb

## 2013-09-15 DIAGNOSIS — R21 Rash and other nonspecific skin eruption: Secondary | ICD-10-CM

## 2013-09-15 MED ORDER — METHYLPREDNISOLONE ACETATE 80 MG/ML IJ SUSP
80.0000 mg | Freq: Once | INTRAMUSCULAR | Status: AC
  Administered 2013-09-15: 80 mg via INTRAMUSCULAR

## 2013-09-15 MED ORDER — IVERMECTIN 3 MG PO TABS: 12.0000 mg | ORAL_TABLET | Freq: Once | ORAL | Status: DC

## 2013-09-15 NOTE — Progress Notes (Signed)
This chart was scribed for Robyn Haber, MD by Roxan Diesel, Scribe.  This patient was seen in [facility] and the patient's care was started at 6:58 PM.  @[facility] @  Patient ID: Angelica Dillon MRN: [id number], DOB: 1981-03-02, 33 y.o. Date of Encounter: 09/15/2013, 6:57 PM  Primary Physician: Anastasio Auerbach, MD  Chief Complaint: Rash  HPI: 33 y.o. year old female with history below presents with an itchy rash on her bilateral forearms and upper legs that began 9 days ago.  Pt states she dyed her hair on 3/20 and had an allergic reaction described as "pus and swelling."  She was placed on a taper of prednisone which she began taking on 3/21, which improved her symptoms.  However after her last dose her symptoms began to recur.  She was given 5 extra days of prednisone but "chopped them up" to make them last longer.  She states her symptoms again improved as she continued taking prednisone.  However on 4/4 she noticed an itchy rash to her bilateral wrists and upper legs.  On 4/8 her coworkers noticed her rash and informed her that it "might be scabies."  She was seen for the rash and placed on Elimite cream which she began using that afternoon.  She states she kept the cream on longer than instructed because the next day she had to take a plane to [place].  During her trip she also began taking her prednisone again.  She states her rash "darkened up" and seemed to be improving initially but has not cleared up entirely.  She also notes it has spread to her neck.  She denies scalp or finger involvement.  She has been using Benadryl, without relief.  She denies any recent suspect exposures or changes to cosmetic or hygiene products.    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 Authorizing Provider  Cetirizine HCl (ZYRTEC PO) Take  by mouth.   Yes Historical Provider, MD  triamcinolone (NASACORT) 55 MCG/ACT AERO nasal inhaler Place 2 sprays into the nose daily.   Yes Historical Provider, MD  Multiple Vitamin (MULTIVITAMIN) tablet Take 1 tablet by mouth daily.    Historical Provider, MD  permethrin (ACTICIN) 5 % cream Apply to entire body and wash off 12 hours later. 09/10/13   Anastasio Auerbach, MD    Allergies: No Known Allergies  History   Social History   Marital Status: Single    Spouse Name: N/A    Number of Children: N/A   Years of Education: N/A   Occupational History   Not on file.   Social History Main Topics   Smoking status: Current Some Day Smoker -- 1.00 packs/day    Types: Cigarettes   Smokeless tobacco: Never Used   Alcohol Use: 2.0 oz/week    4 drink(s) per week   Drug Use: No   Sexual Activity: Yes    Birth Control/ Protection: Condom   Other Topics Concern   Not on file   Social History Narrative   No narrative on file     Review of Systems: Constitutional: negative for chills, fever, night sweats, weight changes, or fatigue  HEENT: negative for vision changes, hearing loss, congestion, rhinorrhea, ST, epistaxis, or sinus pressure Cardiovascular: negative for chest pain or palpitations Respiratory: negative for hemoptysis, wheezing,  shortness of breath, or cough Abdominal: negative for abdominal pain, nausea, vomiting, diarrhea, or constipation Dermatological: positive for rash Neurologic: negative for headache, dizziness, or syncope All other systems reviewed and are otherwise negative with the exception to those above and in the HPI.   Physical Exam: Blood pressure 104/76, pulse 82, temperature 98 F (36.7 C), resp. rate 16, height 5\' 3"  (1.6 m), weight 127 lb (57.607 kg), SpO2 100.00%., Body mass index is 22.5 kg/(m^2). General: Well developed, well nourished, in no acute distress. Head: Normocephalic, atraumatic, eyes without discharge, sclera non-icteric, nares  are without discharge. Bilateral auditory canals clear, TM's are without perforation, pearly grey and translucent with reflective cone of light bilaterally. Oral cavity moist, posterior pharynx without exudate, erythema, peritonsillar abscess, or post nasal drip.  Neck: Supple. No thyromegaly. Full ROM. No lymphadenopathy. Lungs: Clear bilaterally to auscultation without wheezes, rales, or rhonchi. Breathing is unlabored. Heart: RRR with S1 S2. No murmurs, rubs, or gallops appreciated. Abdomen: Soft, non-tender, non-distended with normoactive bowel sounds. No hepatomegaly. No rebound/guarding. No obvious abdominal masses. Msk:  Strength and tone normal for age. Extremities/Skin: Maculopapular rash on both forearms, from the wrist to the elbow, in a nonspecific pattern.  No interdigital webbed space rash.  Skin warm and dry. No clubbing or cyanosis. No edema.  Neuro: Alert and oriented X 3. Moves all extremities spontaneously. Gait is normal. CNII-XII grossly in tact. Psych:  Responds to questions appropriately with a normal affect.    ASSESSMENT AND PLAN:  33 y.o. year old female with allergic appearing rash on her forearms. This could be a reaction to the hair treatment or could be scabies. Rash and nonspecific skin eruption - Plan: methylPREDNISolone acetate (DEPO-MEDROL) injection 80 mg, ivermectin (STROMECTOL) 3 MG TABS tablet    Signed, Robyn Haber, MD 09/15/2013 6:57 PM

## 2013-09-16 ENCOUNTER — Telehealth: Payer: Self-pay

## 2013-09-16 NOTE — Telephone Encounter (Signed)
Patient wanted to speak to someone clinical in regards to her cortisone shot she received yesterday after 5pm. She saw Dr. Joseph Art. She informed him if she has not improved he will make a referral to a dermatologist. She said she still reports some symptoms from the cortisone shot and wanted to know how long she would have to wait for improvement till she calls him back to ask for dermatology referral.   Best: [phone]

## 2013-09-17 ENCOUNTER — Ambulatory Visit: Payer: BC Managed Care – PPO

## 2013-09-17 NOTE — Telephone Encounter (Signed)
Pt states the rash is not any better. She also states she had a major flare up yesterday after her Depo Medrol shot. She is coming in this evening for a recheck with Dr. Joseph Art.

## 2013-11-13 ENCOUNTER — Telehealth: Payer: Self-pay

## 2013-11-13 ENCOUNTER — Other Ambulatory Visit: Payer: Self-pay | Admitting: Gynecology

## 2013-11-13 MED ORDER — FLUCONAZOLE 150 MG PO TABS: 150.0000 mg | ORAL_TABLET | Freq: Once | ORAL | Status: DC

## 2013-11-13 NOTE — Telephone Encounter (Signed)
Diflucan 150 mg times 1 tab okay.

## 2013-11-13 NOTE — Telephone Encounter (Signed)
Rx sent. Patient informed. 

## 2013-11-13 NOTE — Telephone Encounter (Signed)
Patient had appt on 6/26 for her CE. C/O yeast infection symptoms and is familiar with them.  She is asking if you would call her in a Diflucan tab as she will not be able to work out an office visit prior to CE because of her job.  She said she could use OTC cream but hates how messy.  [place] for Diflucan?

## 2013-11-20 ENCOUNTER — Encounter: Payer: Self-pay | Admitting: Women's Health

## 2013-11-20 ENCOUNTER — Ambulatory Visit (INDEPENDENT_AMBULATORY_CARE_PROVIDER_SITE_OTHER): Payer: BC Managed Care – PPO | Admitting: Women's Health

## 2013-11-20 DIAGNOSIS — N898 Other specified noninflammatory disorders of vagina: Secondary | ICD-10-CM

## 2013-11-20 DIAGNOSIS — B9689 Other specified bacterial agents as the cause of diseases classified elsewhere: Secondary | ICD-10-CM

## 2013-11-20 DIAGNOSIS — N760 Acute vaginitis: Secondary | ICD-10-CM

## 2013-11-20 DIAGNOSIS — A499 Bacterial infection, unspecified: Secondary | ICD-10-CM

## 2013-11-20 LAB — WET PREP FOR TRICH, YEAST, CLUE
Trich, Wet Prep: NONE SEEN
WBC WET PREP: NONE SEEN
Yeast Wet Prep HPF POC: NONE SEEN

## 2013-11-20 MED ORDER — METRONIDAZOLE 500 MG PO TABS: 500.0000 mg | ORAL_TABLET | Freq: Two times a day (BID) | ORAL | Status: DC

## 2013-11-20 NOTE — Patient Instructions (Signed)
Bacterial Vaginosis Bacterial vaginosis is an infection of the vagina. It happens when too many of certain germs (bacteria) grow in the vagina. HOME CARE  Take your medicine as told by your doctor.  Finish your medicine even if you start to feel better.  Do not have sex until you finish your medicine and are better.  Tell your sex partner that you have an infection. They should see their doctor for treatment.  Practice safe sex. Use condoms. Have only one sex partner. GET HELP IF:  You are not getting better after 3 days of treatment.  You have more grey fluid (discharge) coming from your vagina than before.  You have more pain than before.  You have a fever. MAKE SURE YOU:   Understand these instructions.  Will watch your condition.  Will get help right away if you are not doing well or get worse. Document Released: 02/29/2008 Document Revised: 03/12/2013 Document Reviewed: 01/01/2013 Exit[employer] Patient Information 2015 [employer]. This information is not intended to replace advice given to you by your health care provider. Make sure you discuss any questions you have with your health care provider.  

## 2013-11-20 NOTE — Progress Notes (Signed)
Patient ID: ARBUTUS NELLIGAN, female   DOB: 10-14-1980, 33 y.o.   MRN: [id number]  Presents with complaint of vaginal discharge with odor x 9 days. Reports discharge that is similar to past yeast infections and called Dr. Phineas Real on 11/13/13 received 150mg  Diflucan x 1 dose with no relief. Denies itching, burning, urinary symptoms, spotting, fevers or chills. New partner for 1 year. Cycles for 3-5 days irregularly uses condoms for contraception.   Exam: appears well  External genitalia: normal Speculum: thin white-yellow vaginal discharge with odor Wet Prep: positive amine, moderate clue cells, TNTC bacteria, GC/Chlamydia culture taken Bimanual no CMT or adnexal fullness or tenderness  Bacterial Vaginosis  Plan: Metronidazole 500mg  BID x 7 days. Alcohol precautions reviewed. GC/Chlamydia cultures pending.  Instructed to call if symptoms worsen do not improve. Continue regular condom use for STD protection/conception. Declines other contraception. Has annual exam scheduled in one week we'll check HIV, hepatitis and RPR

## 2013-11-22 LAB — GC/CHLAMYDIA PROBE AMP
CT Probe RNA: NEGATIVE
GC Probe RNA: NEGATIVE

## 2013-11-28 ENCOUNTER — Encounter: Payer: BC Managed Care – PPO | Admitting: Gynecology

## 2014-01-01 ENCOUNTER — Ambulatory Visit: Payer: BC Managed Care – PPO | Admitting: Gynecology

## 2014-01-09 ENCOUNTER — Other Ambulatory Visit (HOSPITAL_COMMUNITY)
Admission: RE | Admit: 2014-01-09 | Discharge: 2014-01-09 | Disposition: A | Discharge status: Home / Self Care | Payer: BC Managed Care – PPO | Source: Ambulatory Visit | Attending: Gynecology | Admitting: Gynecology

## 2014-01-09 ENCOUNTER — Encounter: Payer: Self-pay | Admitting: Gynecology

## 2014-01-09 ENCOUNTER — Ambulatory Visit (INDEPENDENT_AMBULATORY_CARE_PROVIDER_SITE_OTHER): Payer: BC Managed Care – PPO | Admitting: Gynecology

## 2014-01-09 VITALS — BP 110/70 | Ht 64.0 in | Wt 129.0 lb

## 2014-01-09 DIAGNOSIS — N926 Irregular menstruation, unspecified: Secondary | ICD-10-CM

## 2014-01-09 DIAGNOSIS — Z01419 Encounter for gynecological examination (general) (routine) without abnormal findings: Secondary | ICD-10-CM | POA: Insufficient documentation

## 2014-01-09 DIAGNOSIS — Z1151 Encounter for screening for human papillomavirus (HPV): Secondary | ICD-10-CM | POA: Insufficient documentation

## 2014-01-09 LAB — COMPREHENSIVE METABOLIC PANEL
ALBUMIN: 4.2 g/dL (ref 3.5–5.2)
ALK PHOS: 41 U/L (ref 39–117)
ALT: 15 U/L (ref 0–35)
AST: 17 U/L (ref 0–37)
BILIRUBIN TOTAL: 0.2 mg/dL (ref 0.2–1.2)
BUN: 10 mg/dL (ref 6–23)
CO2: 21 mEq/L (ref 19–32)
CREATININE: 0.77 mg/dL (ref 0.50–1.10)
Calcium: 8.9 mg/dL (ref 8.4–10.5)
Chloride: 108 mEq/L (ref 96–112)
GLUCOSE: 90 mg/dL (ref 70–99)
Potassium: 4.7 mEq/L (ref 3.5–5.3)
Sodium: 136 mEq/L (ref 135–145)
TOTAL PROTEIN: 6.8 g/dL (ref 6.0–8.3)

## 2014-01-09 LAB — CBC WITH DIFFERENTIAL/PLATELET
BASOS PCT: 1 % (ref 0–1)
Basophils Absolute: 0.1 10*3/uL (ref 0.0–0.1)
Eosinophils Absolute: 0.1 10*3/uL (ref 0.0–0.7)
Eosinophils Relative: 2 % (ref 0–5)
HEMATOCRIT: 36.3 % (ref 36.0–46.0)
Hemoglobin: 12 g/dL (ref 12.0–15.0)
LYMPHS ABS: 2.6 10*3/uL (ref 0.7–4.0)
Lymphocytes Relative: 44 % (ref 12–46)
MCH: 23.1 pg — ABNORMAL LOW (ref 26.0–34.0)
MCHC: 33.1 g/dL (ref 30.0–36.0)
MCV: 69.8 fL — ABNORMAL LOW (ref 78.0–100.0)
MONO ABS: 0.5 10*3/uL (ref 0.1–1.0)
Monocytes Relative: 9 % (ref 3–12)
NEUTROS ABS: 2.6 10*3/uL (ref 1.7–7.7)
Neutrophils Relative %: 44 % (ref 43–77)
Platelets: 258 10*3/uL (ref 150–400)
RBC: 5.2 MIL/uL — AB (ref 3.87–5.11)
RDW: 14.9 % (ref 11.5–15.5)
WBC: 5.8 10*3/uL (ref 4.0–10.5)

## 2014-01-09 LAB — HCG, SERUM, QUALITATIVE: Preg, Serum: NEGATIVE

## 2014-01-09 NOTE — Patient Instructions (Signed)
Call if you do not start her period by next week. Seriously consider the IUD for contraception Use condoms consistently to prevent pregnancy Followup in one year for annual exam  Consider Stop Smoking.  Help is available at [facility] smoking cessation program @ www.[facility].com or 832-614-3249. OR 1-800-QUIT-NOW 438-598-3551) for free smoking cessation counseling.  Smokefree.gov (Inrails.tn) provides free, accurate, evidence-based information and professional assistance to help support the immediate and long-term needs of people trying to quit smoking.    Smoking Hazards Smoking cigarettes is extremely bad for your health. Tobacco smoke has over 200 known poisons in it. There are over 60 chemicals in tobacco smoke that cause cancer. Some of the chemicals found in cigarette smoke include:  Cyanide.  Benzene.  Formaldehyde.  Methanol (wood alcohol).  Acetylene (fuel used in welding torches).  Ammonia.  Cigarette smoke also contains the poisonous gases nitrogen oxide and carbon monoxide.  Cigarette smokers have an increased risk of many serious medical problems, including: Lung cancer.  Lung disease (such as pneumonia, bronchitis, and emphysema).  Heart attack and chest pain due to the heart not getting enough oxygen (angina).  Heart disease and peripheral blood vessel disease.  Hypertension.  Stroke.  Oral cancer (cancer of the lip, mouth, or voice box).  Bladder cancer.  Pancreatic cancer.  Cervical cancer.  Pregnancy complications, including premature birth.  Low birthweight babies.  Early menopause.  Lower estrogen level for women.  Infertility.  Facial wrinkles.  Blindness.  Increased risk of broken bones (fractures).  Senile dementia.  Stillbirths and smaller newborn babies, birth defects, and genetic damage to sperm.  Stomach ulcers and internal bleeding.  Children of smokers have an increased risk of the following, because of secondhand  smoke exposure:  Sudden infant death syndrome (SIDS).  Respiratory infections.  Lung cancer.  Heart disease.  Ear infections.  Smoking causes approximately: 90% of all lung cancer deaths in men.  80% of all lung cancer deaths in women.  90% of deaths from chronic obstructive lung disease.  Compared with nonsmokers, smoking increases the risk of: Coronary heart disease by 2 to 4 times.  Stroke by 2 to 4 times.  Men developing lung cancer by 23 times.  Women developing lung cancer by 13 times.  Dying from chronic obstructive lung diseases by 12 times.  Someone who smokes 2 packs a day loses about 8 years of his or her life. Even smoking lightly shortens your life expectancy by several years. You can greatly reduce the risk of medical problems for you and your family by stopping now. Smoking is the most preventable cause of death and disease in our society. Within days of quitting smoking, your circulation returns to normal, you decrease the risk of having a heart attack, and your lung capacity improves. There may be some increased phlegm in the first few days after quitting, and it may take months for your lungs to clear up completely. Quitting for 10 years cuts your lung cancer risk to almost that of a nonsmoker. WHY IS SMOKING ADDICTIVE? Nicotine is the chemical agent in tobacco that is capable of causing addiction or dependence.  When you smoke and inhale, nicotine is absorbed rapidly into the bloodstream through your lungs. Nicotine absorbed through the lungs is capable of creating a powerful addiction. Both inhaled and non-inhaled nicotine may be addictive.  Addiction studies of cigarettes and spit tobacco show that addiction to nicotine occurs mainly during the teen years, when young people begin using tobacco products.  WHAT ARE THE BENEFITS OF QUITTING?  There are many health benefits to quitting smoking.  Likelihood of developing cancer and heart disease decreases. Health improvements  are seen almost immediately.  Blood pressure, pulse rate, and breathing patterns start returning to normal soon after quitting.  People who quit may see an improvement in their overall quality of life.  Some people choose to quit all at once. Other options include nicotine replacement products, such as patches, gum, and nasal sprays. Do not use these products without first checking with your caregiver. QUITTING SMOKING It is not easy to quit smoking. Nicotine is addicting, and longtime habits are hard to change. To start, you can write down all your reasons for quitting, tell your family and friends you want to quit, and ask for their help. Throw your cigarettes away, chew gum or cinnamon sticks, keep your hands busy, and drink extra water or juice. Go for walks and practice deep breathing to relax. Think of all the money you are saving: around $1,000 a year, for the average pack-a-day smoker. Nicotine patches and gum have been shown to improve success at efforts to stop smoking. Zyban (bupropion) is an anti-depressant drug that can be prescribed to reduce nicotine withdrawal symptoms and to suppress the urge to smoke. Smoking is an addiction with both physical and psychological effects. Joining a stop-smoking support group can help you cope with the emotional issues. For more information and advice on programs to stop smoking, call your doctor, your local hospital, or these organizations: American Lung Association - 1-800-LUNGUSA   Smoking Cessation  This document explains the best ways for you to quit smoking and new treatments to help. It lists new medicines that can double or triple your chances of quitting and quitting for good. It also considers ways to avoid relapses and concerns you may have about quitting, including weight gain. NICOTINE: A POWERFUL ADDICTION If you have tried to quit smoking, you know how hard it can be. It is hard because nicotine is a very addictive drug. For some people, it  can be as addictive as heroin or cocaine. Usually, people make 2 or 3 tries, or more, before finally being able to quit. Each time you try to quit, you can learn about what helps and what hurts. Quitting takes hard work and a lot of effort, but you can quit smoking. QUITTING SMOKING IS ONE OF THE MOST IMPORTANT THINGS YOU WILL EVER DO.  You will live longer, feel better, and live better.   The impact on your body of quitting smoking is felt almost immediately:   Within 20 minutes, blood pressure decreases. Pulse returns to its normal level.   After 8 hours, carbon monoxide levels in the blood return to normal. Oxygen level increases.   After 24 hours, chance of heart attack starts to decrease. Breath, hair, and body stop smelling like smoke.   After 48 hours, damaged nerve endings begin to recover. Sense of taste and smell improve.   After 72 hours, the body is virtually free of nicotine. Bronchial tubes relax and breathing becomes easier.   After 2 to 12 weeks, lungs can hold more air. Exercise becomes easier and circulation improves.   Quitting will reduce your risk of having a heart attack, stroke, cancer, or lung disease:   After 1 year, the risk of coronary heart disease is cut in half.   After 5 years, the risk of stroke falls to the same as a nonsmoker.   After 10  years, the risk of lung cancer is cut in half and the risk of other cancers decreases significantly.   After 15 years, the risk of coronary heart disease drops, usually to the level of a nonsmoker.   If you are pregnant, quitting smoking will improve your chances of having a healthy baby.   The people you live with, especially your children, will be healthier.   You will have extra money to spend on things other than cigarettes.  FIVE KEYS TO QUITTING Studies have shown that these 5 steps will help you quit smoking and quit for good. You have the best chances of quitting if you use them together: 1. Get ready.   2. Get support and encouragement.  3. Learn new skills and behaviors.  4. Get medicine to reduce your nicotine addiction and use it correctly.  5. Be prepared for relapse or difficult situations. Be determined to continue trying to quit, even if you do not succeed at first.  1. GET READY  Set a quit date.   Change your environment.   Get rid of ALL cigarettes, ashtrays, matches, and lighters in your home, car, and place of work.   Do not let people smoke in your home.   Review your past attempts to quit. Think about what worked and what did not.   Once you quit, do not smoke. NOT EVEN A PUFF!  2. GET SUPPORT AND ENCOURAGEMENT Studies have shown that you have a better chance of being successful if you have help. You can get support in many ways.  Tell your family, friends, and coworkers that you are going to quit and need their support. Ask them not to smoke around you.   Talk to your caregivers (doctor, dentist, nurse, pharmacist, psychologist, and/or smoking counselor).   Get individual, group, or telephone counseling and support. The more counseling you have, the better your chances are of quitting. Programs are available at [employer] and health centers. Call your local health department for information about programs in your area.   Spiritual beliefs and practices may help some smokers quit.   Quit meters are Insurance underwriter that keep track of quit statistics, such as amount of "quit-time," cigarettes not smoked, and money saved.   Many smokers find one or more of the many self-help books available useful in helping them quit and stay off tobacco.  3. LEARN NEW SKILLS AND BEHAVIORS  Try to distract yourself from urges to smoke. Talk to someone, go for a walk, or occupy your time with a task.   When you first try to quit, change your routine. Take a different route to work. Drink tea instead of coffee. Eat breakfast in a different place.    Do something to reduce your stress. Take a hot bath, exercise, or read a book.   Plan something enjoyable to do every day. Reward yourself for not smoking.   Explore interactive web-based programs that specialize in helping you quit.  4. GET MEDICINE AND USE IT CORRECTLY Medicines can help you stop smoking and decrease the urge to smoke. Combining medicine with the above behavioral methods and support can quadruple your chances of successfully quitting smoking. The [place] Food and Drug Administration (FDA) has approved 7 medicines to help you quit smoking. These medicines fall into 3 categories.  Nicotine replacement therapy (delivers nicotine to your body without the negative effects and risks of smoking):   Nicotine gum: Available over-the-counter.   Nicotine lozenges: Available  over-the-counter.   Nicotine inhaler: Available by prescription.   Nicotine nasal spray: Available by prescription.   Nicotine skin patches (transdermal): Available by prescription and over-the-counter.   Antidepressant medicine (helps people abstain from smoking, but how this works is unknown):   Bupropion sustained-release (SR) tablets: Available by prescription.   Nicotinic receptor partial agonist (simulates the effect of nicotine in your brain):   Varenicline tartrate tablets: Available by prescription.   Ask your caregiver for advice about which medicines to use and how to use them. Carefully read the information on the package.   Everyone who is trying to quit may benefit from using a medicine. If you are pregnant or trying to become pregnant, nursing an infant, you are under age 8, or you smoke fewer than 10 cigarettes per day, talk to your caregiver before taking any nicotine replacement medicines.   You should stop using a nicotine replacement product and call your caregiver if you experience nausea, dizziness, weakness, vomiting, fast or irregular heartbeat, mouth problems with the lozenge or  gum, or redness or swelling of the skin around the patch that does not go away.   Do not use any other product containing nicotine while using a nicotine replacement product.   Talk to your caregiver before using these products if you have diabetes, heart disease, asthma, stomach ulcers, you had a recent heart attack, you have high blood pressure that is not controlled with medicine, a history of irregular heartbeat, or you have been prescribed medicine to help you quit smoking.  5. BE PREPARED FOR RELAPSE OR DIFFICULT SITUATIONS  Most relapses occur within the first 3 months after quitting. Do not be discouraged if you start smoking again. Remember, most people try several times before they finally quit.   You may have symptoms of withdrawal because your body is used to nicotine. You may crave cigarettes, be irritable, feel very hungry, cough often, get headaches, or have difficulty concentrating.   The withdrawal symptoms are only temporary. They are strongest when you first quit, but they will go away within 10 to 14 days.  Here are some difficult situations to watch for:  Alcohol. Avoid drinking alcohol. Drinking lowers your chances of successfully quitting.   Caffeine. Try to reduce the amount of caffeine you consume. It also lowers your chances of successfully quitting.   Other smokers. Being around smoking can make you want to smoke. Avoid smokers.   Weight gain. Many smokers will gain weight when they quit, usually less than 10 pounds. Eat a healthy diet and stay active. Do not let weight gain distract you from your main goal, quitting smoking. Some medicines that help you quit smoking may also help delay weight gain. You can always lose the weight gained after you quit.   Bad mood or depression. There are a lot of ways to improve your mood other than smoking.  If you are having problems with any of these situations, talk to your caregiver. SPECIAL SITUATIONS AND CONDITIONS Studies  suggest that everyone can quit smoking. Your situation or condition can give you a special reason to quit.  Pregnant women/new mothers: By quitting, you protect your baby's health and your own.   Hospitalized patients: By quitting, you reduce health problems and help healing.   Heart attack patients: By quitting, you reduce your risk of a second heart attack.   Lung, head, and neck cancer patients: By quitting, you reduce your chance of a second cancer.   Parents of children and  adolescents: By quitting, you protect your children from illnesses caused by secondhand smoke.  QUESTIONS TO THINK ABOUT Think about the following questions before you try to stop smoking. You may want to talk about your answers with your caregiver.  Why do you want to quit?   If you tried to quit in the past, what helped and what did not?   What will be the most difficult situations for you after you quit? How will you plan to handle them?   Who can help you through the tough times? Your family? Friends? Caregiver?   What pleasures do you get from smoking? What ways can you still get pleasure if you quit?  Here are some questions to ask your caregiver:  How can you help me to be successful at quitting?   What medicine do you think would be best for me and how should I take it?   What should I do if I need more help?   What is smoking withdrawal like? How can I get information on withdrawal?  Quitting takes hard work and a lot of effort, but you can quit smoking.

## 2014-01-09 NOTE — Addendum Note (Signed)
Addended by: Nelva Nay on: 01/09/2014 08:50 AM   Modules accepted: Orders

## 2014-01-09 NOTE — Progress Notes (Signed)
Angelica Dillon 1981-01-02 [id number]        33 y.o.  G1P0010 for annual exam.  Several issues noted below.  Past medical history,surgical history, problem list, medications, allergies, family history and social history were all reviewed and documented as reviewed in the EPIC chart.  ROS:  12 system ROS performed with pertinent positives and negatives included in the history, assessment and plan.   Additional significant findings :  None   Exam: Kim Counsellor Vitals:   01/09/14 0813  BP: 110/70  Height: 5\' 4"  (1.626 m)  Weight: 129 lb (58.514 kg)   General appearance:  Normal affect, orientation and appearance. Skin: Grossly normal HEENT: Without gross lesions.  No cervical or supraclavicular adenopathy. Thyroid normal.  Lungs:  Clear without wheezing, rales or rhonchi Cardiac: RR, without RMG Abdominal:  Soft, nontender, without masses, guarding, rebound, organomegaly or hernia Breasts:  Examined lying and sitting without masses, retractions, discharge or axillary adenopathy. Pelvic:  Ext/BUS/vagina normal  Cervix normal. Pap/HPV  Uterus anteverted, normal size, shape and contour, midline and mobile nontender   Adnexa  Without masses or tenderness    Anus and perineum  Normal   Rectovaginal  Normal sphincter tone without palpated masses or tenderness.    Assessment/Plan:  33 y.o. G26P0010 female for annual exam.   1. LMP 12/05/2013. One week late for her menses. Feels like it's about ready to start. Exam is normal. Check hCG. Assuming negative, call if patient does not start menses in one week for progesterone withdrawal. 2. Contraception. Patient inconsistent with condoms. Reviewed pregnancy risk. Continues to smoke at age 33. Strongly recommended patient consider IUD for contraception. Risks/benefits reviewed. Patient will consider. 3. Mammography. I reviewed screening mammogram recommendations between 35 and 40. Patient has no strong family history and prefers to wait  closer to 67. SBE monthly reviewed. 4. Pap smear 2012. Pap/HPV today. History of abnormal Pap smear as a teenager. Normal Pap smears since. Plan repeat Pap smear at 3-5 year interval assuming this Pap smear is normal per current screening guidelines. 5. Stop smoking recommendations again reviewed. 6. Health maintenance. Baseline CBC comprehensive metabolic panel urinalysis ordered. Lipid profile last year are normal followup for contraceptive decision otherwise annually.   Note: This document was prepared with digital dictation and possible smart phrase technology. Any transcriptional errors that result from this process are unintentional.   Anastasio Auerbach MD, 8:43 AM 01/09/2014

## 2014-01-10 LAB — URINALYSIS W MICROSCOPIC + REFLEX CULTURE
Bacteria, UA: NONE SEEN
Bilirubin Urine: NEGATIVE
CASTS: NONE SEEN
Crystals: NONE SEEN
Glucose, UA: NEGATIVE mg/dL
Hgb urine dipstick: NEGATIVE
Ketones, ur: NEGATIVE mg/dL
LEUKOCYTES UA: NEGATIVE
NITRITE: NEGATIVE
PH: 5.5 (ref 5.0–8.0)
PROTEIN: NEGATIVE mg/dL
SQUAMOUS EPITHELIAL / LPF: NONE SEEN
Specific Gravity, Urine: 1.025 (ref 1.005–1.030)
UROBILINOGEN UA: 0.2 mg/dL (ref 0.0–1.0)

## 2014-01-12 LAB — CYTOLOGY - PAP

## 2014-02-18 ENCOUNTER — Telehealth: Payer: Self-pay | Admitting: *Deleted

## 2014-02-18 NOTE — Telephone Encounter (Signed)
Pt had annual in Aug. Has tired to stop smoking several times with chantix, pt asked if you would be willing to give rx for Wellbutrin for smoking? Or OV to discuss?

## 2014-02-19 NOTE — Telephone Encounter (Signed)
Office visit to discuss?

## 2014-02-19 NOTE — Telephone Encounter (Signed)
Pt informed with the below note. 

## 2014-02-24 ENCOUNTER — Ambulatory Visit: Payer: BC Managed Care – PPO | Admitting: Gynecology

## 2014-02-25 ENCOUNTER — Ambulatory Visit (INDEPENDENT_AMBULATORY_CARE_PROVIDER_SITE_OTHER): Payer: BC Managed Care – PPO | Admitting: Gynecology

## 2014-02-25 ENCOUNTER — Encounter: Payer: Self-pay | Admitting: Gynecology

## 2014-02-25 DIAGNOSIS — Z716 Tobacco abuse counseling: Secondary | ICD-10-CM

## 2014-02-25 DIAGNOSIS — N760 Acute vaginitis: Secondary | ICD-10-CM

## 2014-02-25 DIAGNOSIS — F17200 Nicotine dependence, unspecified, uncomplicated: Secondary | ICD-10-CM

## 2014-02-25 DIAGNOSIS — Z7189 Other specified counseling: Secondary | ICD-10-CM

## 2014-02-25 DIAGNOSIS — A499 Bacterial infection, unspecified: Secondary | ICD-10-CM

## 2014-02-25 DIAGNOSIS — B9689 Other specified bacterial agents as the cause of diseases classified elsewhere: Secondary | ICD-10-CM

## 2014-02-25 LAB — WET PREP FOR TRICH, YEAST, CLUE
TRICH WET PREP: NONE SEEN
YEAST WET PREP: NONE SEEN

## 2014-02-25 MED ORDER — BUPROPION HCL ER (XL) 150 MG PO TB24: 150.0000 mg | ORAL_TABLET | Freq: Every day | ORAL | Status: DC

## 2014-02-25 MED ORDER — METRONIDAZOLE 500 MG PO TABS: 500.0000 mg | ORAL_TABLET | Freq: Two times a day (BID) | ORAL | Status: DC

## 2014-02-25 NOTE — Patient Instructions (Addendum)
Bupropion extended-release tablets (Depression/Mood Disorders) What is this medicine? BUPROPION (byoo PROE pee on) is used to treat depression. This medicine may be used for other purposes; ask your health care provider or pharmacist if you have questions. COMMON BRAND NAME(S): Aplenzin, Budeprion XL, Forfivo XL, Wellbutrin XL What should I tell my health care provider before I take this medicine? They need to know if you have any of these conditions: -an eating disorder, such as anorexia or bulimia -bipolar disorder or psychosis -diabetes or high blood sugar, treated with medication -glaucoma -head injury or brain tumor -heart disease, previous heart attack, or irregular heart beat -high blood pressure -kidney or liver disease -seizures (convulsions) -suicidal thoughts or a previous suicide attempt -Tourette's syndrome -weight loss -an unusual or allergic reaction to bupropion, other medicines, foods, dyes, or preservatives -breast-feeding -pregnant or trying to become pregnant How should I use this medicine? Take this medicine by mouth with a glass of water. Follow the directions on the prescription label. You can take it with or without food. If it upsets your stomach, take it with food. Do not crush, chew, or cut these tablets. This medicine is taken once daily at the same time each day. Do not take your medicine more often than directed. Do not stop taking this medicine suddenly except upon the advice of your doctor. Stopping this medicine too quickly may cause serious side effects or your condition may worsen. A special MedGuide will be given to you by the pharmacist with each prescription and refill. Be sure to read this information carefully each time. Talk to your pediatrician regarding the use of this medicine in children. Special care may be needed. Overdosage: If you think you have taken too much of this medicine contact a poison control center or emergency room at once. NOTE:  This medicine is only for you. Do not share this medicine with others. What if I miss a dose? If you miss a dose, skip the missed dose and take your next tablet at the regular time. Do not take double or extra doses. What may interact with this medicine? Do not take this medicine with any of the following medications: -linezolid -MAOIs like Azilect, Carbex, Eldepryl, Marplan, Nardil, and Parnate -methylene blue (injected into a vein) -other medicines that contain bupropion like Zyban This medicine may also interact with the following medications: -alcohol -certain medicines for anxiety or sleep -certain medicines for blood pressure like metoprolol, propranolol -certain medicines for depression or psychotic disturbances -certain medicines for HIV or AIDS like efavirenz, lopinavir, nelfinavir, ritonavir -certain medicines for irregular heart beat like propafenone, flecainide -certain medicines for Parkinson's disease like amantadine, levodopa -certain medicines for seizures like carbamazepine, phenytoin, phenobarbital -cimetidine -clopidogrel -cyclophosphamide -furazolidone -isoniazid -nicotine -orphenadrine -procarbazine -steroid medicines like prednisone or cortisone -stimulant medicines for attention disorders, weight loss, or to stay awake -tamoxifen -theophylline -thiotepa -ticlopidine -tramadol -warfarin This list may not describe all possible interactions. Give your health care provider a list of all the medicines, herbs, non-prescription drugs, or dietary supplements you use. Also tell them if you smoke, drink alcohol, or use illegal drugs. Some items may interact with your medicine. What should I watch for while using this medicine? Tell your doctor if your symptoms do not get better or if they get worse. Visit your doctor or health care professional for regular checks on your progress. Because it may take several weeks to see the full effects of this medicine, it is  important to continue your treatment as prescribed   by your doctor. Patients and their families should watch out for new or worsening thoughts of suicide or depression. Also watch out for sudden changes in feelings such as feeling anxious, agitated, panicky, irritable, hostile, aggressive, impulsive, severely restless, overly excited and hyperactive, or not being able to sleep. If this happens, especially at the beginning of treatment or after a change in dose, call your health care professional. Avoid alcoholic drinks while taking this medicine. Drinking large amounts of alcoholic beverages, using sleeping or anxiety medicines, or quickly stopping the use of these agents while taking this medicine may increase your risk for a seizure. Do not drive or use heavy machinery until you know how this medicine affects you. This medicine can impair your ability to perform these tasks. Do not take this medicine close to bedtime. It may prevent you from sleeping. Your mouth may get dry. Chewing sugarless gum or sucking hard candy, and drinking plenty of water may help. Contact your doctor if the problem does not go away or is severe. The tablet shell for some brands of this medicine does not dissolve. This is normal. The tablet shell may appear whole in the stool. This is not a cause for concern. What side effects may I notice from receiving this medicine? Side effects that you should report to your doctor or health care professional as soon as possible: -allergic reactions like skin rash, itching or hives, swelling of the face, lips, or tongue -breathing problems -changes in vision -confusion -fast or irregular heartbeat -hallucinations -increased blood pressure -redness, blistering, peeling or loosening of the skin, including inside the mouth -seizures -suicidal thoughts or other mood changes -unusually weak or tired -vomiting Side effects that usually do not require medical attention (report to your  doctor or health care professional if they continue or are bothersome): -change in sex drive or performance -constipation -headache -loss of appetite -nausea -tremors -weight loss This list may not describe all possible side effects. Call your doctor for medical advice about side effects. You may report side effects to FDA at 1-800-FDA-1088. Where should I keep my medicine? Keep out of the reach of children. Store at room temperature between 15 and 30 degrees C (59 and 86 degrees F). Throw away any unused medicine after the expiration date. NOTE: This sheet is a summary. It may not cover all possible information. If you have questions about this medicine, talk to your doctor, pharmacist, or health care provider.  2015, Elsevier/Gold Standard. (2012-12-13 12:39:42)   Consider Stop Smoking.  Help is available at [facility] smoking cessation program @ www.[email or web address] or 640-010-1008. OR 1-800-QUIT-NOW 863-689-3807) for free smoking cessation counseling.  Smokefree.gov (Inrails.tn) provides free, accurate, evidence-based information and professional assistance to help support the immediate and long-term needs of people trying to quit smoking.    Smoking Hazards Smoking cigarettes is extremely bad for your health. Tobacco smoke has over 200 known poisons in it. There are over 60 chemicals in tobacco smoke that cause cancer. Some of the chemicals found in cigarette smoke include:  Cyanide.  Benzene.  Formaldehyde.  Methanol (wood alcohol).  Acetylene (fuel used in welding torches).  Ammonia.  Cigarette smoke also contains the poisonous gases nitrogen oxide and carbon monoxide.  Cigarette smokers have an increased risk of many serious medical problems, including: Lung cancer.  Lung disease (such as pneumonia, bronchitis, and emphysema).  Heart attack and chest pain due to the heart not getting enough oxygen (angina).  Heart disease and peripheral blood vessel  disease.  Hypertension.  Stroke.  Oral cancer (cancer of the lip, mouth, or voice box).  Bladder cancer.  Pancreatic cancer.  Cervical cancer.  Pregnancy complications, including premature birth.  Low birthweight babies.  Early menopause.  Lower estrogen level for women.  Infertility.  Facial wrinkles.  Blindness.  Increased risk of broken bones (fractures).  Senile dementia.  Stillbirths and smaller newborn babies, birth defects, and genetic damage to sperm.  Stomach ulcers and internal bleeding.  Children of smokers have an increased risk of the following, because of secondhand smoke exposure:  Sudden infant death syndrome (SIDS).  Respiratory infections.  Lung cancer.  Heart disease.  Ear infections.  Smoking causes approximately: 90% of all lung cancer deaths in men.  80% of all lung cancer deaths in women.  90% of deaths from chronic obstructive lung disease.  Compared with nonsmokers, smoking increases the risk of: Coronary heart disease by 2 to 4 times.  Stroke by 2 to 4 times.  Men developing lung cancer by 23 times.  Women developing lung cancer by 13 times.  Dying from chronic obstructive lung diseases by 12 times.  Someone who smokes 2 packs a day loses about 8 years of his or her life. Even smoking lightly shortens your life expectancy by several years. You can greatly reduce the risk of medical problems for you and your family by stopping now. Smoking is the most preventable cause of death and disease in our society. Within days of quitting smoking, your circulation returns to normal, you decrease the risk of having a heart attack, and your lung capacity improves. There may be some increased phlegm in the first few days after quitting, and it may take months for your lungs to clear up completely. Quitting for 10 years cuts your lung cancer risk to almost that of a nonsmoker. WHY IS SMOKING ADDICTIVE? Nicotine is the chemical agent in tobacco that is capable of causing  addiction or dependence.  When you smoke and inhale, nicotine is absorbed rapidly into the bloodstream through your lungs. Nicotine absorbed through the lungs is capable of creating a powerful addiction. Both inhaled and non-inhaled nicotine may be addictive.  Addiction studies of cigarettes and spit tobacco show that addiction to nicotine occurs mainly during the teen years, when young people begin using tobacco products.  WHAT ARE THE BENEFITS OF QUITTING?  There are many health benefits to quitting smoking.  Likelihood of developing cancer and heart disease decreases. Health improvements are seen almost immediately.  Blood pressure, pulse rate, and breathing patterns start returning to normal soon after quitting.  People who quit may see an improvement in their overall quality of life.  Some people choose to quit all at once. Other options include nicotine replacement products, such as patches, gum, and nasal sprays. Do not use these products without first checking with your caregiver. QUITTING SMOKING It is not easy to quit smoking. Nicotine is addicting, and longtime habits are hard to change. To start, you can write down all your reasons for quitting, tell your family and friends you want to quit, and ask for their help. Throw your cigarettes away, chew gum or cinnamon sticks, keep your hands busy, and drink extra water or juice. Go for walks and practice deep breathing to relax. Think of all the money you are saving: around $1,000 a year, for the average pack-a-day smoker. Nicotine patches and gum have been shown to improve success at efforts to stop smoking. Zyban (bupropion) is an anti-depressant drug  that can be prescribed to reduce nicotine withdrawal symptoms and to suppress the urge to smoke. Smoking is an addiction with both physical and psychological effects. Joining a stop-smoking support group can help you cope with the emotional issues. For more information and advice on programs to stop  smoking, call your doctor, your local hospital, or these organizations: American Lung Association - 1-800-LUNGUSA   Smoking Cessation  This document explains the best ways for you to quit smoking and new treatments to help. It lists new medicines that can double or triple your chances of quitting and quitting for good. It also considers ways to avoid relapses and concerns you may have about quitting, including weight gain. NICOTINE: A POWERFUL ADDICTION If you have tried to quit smoking, you know how hard it can be. It is hard because nicotine is a very addictive drug. For some people, it can be as addictive as heroin or cocaine. Usually, people make 2 or 3 tries, or more, before finally being able to quit. Each time you try to quit, you can learn about what helps and what hurts. Quitting takes hard work and a lot of effort, but you can quit smoking. QUITTING SMOKING IS ONE OF THE MOST IMPORTANT THINGS YOU WILL EVER DO.  You will live longer, feel better, and live better.   The impact on your body of quitting smoking is felt almost immediately:   Within 20 minutes, blood pressure decreases. Pulse returns to its normal level.   After 8 hours, carbon monoxide levels in the blood return to normal. Oxygen level increases.   After 24 hours, chance of heart attack starts to decrease. Breath, hair, and body stop smelling like smoke.   After 48 hours, damaged nerve endings begin to recover. Sense of taste and smell improve.   After 72 hours, the body is virtually free of nicotine. Bronchial tubes relax and breathing becomes easier.   After 2 to 12 weeks, lungs can hold more air. Exercise becomes easier and circulation improves.   Quitting will reduce your risk of having a heart attack, stroke, cancer, or lung disease:   After 1 year, the risk of coronary heart disease is cut in half.   After 5 years, the risk of stroke falls to the same as a nonsmoker.   After 10 years, the risk of lung  cancer is cut in half and the risk of other cancers decreases significantly.   After 15 years, the risk of coronary heart disease drops, usually to the level of a nonsmoker.   If you are pregnant, quitting smoking will improve your chances of having a healthy baby.   The people you live with, especially your children, will be healthier.   You will have extra money to spend on things other than cigarettes.  FIVE KEYS TO QUITTING Studies have shown that these 5 steps will help you quit smoking and quit for good. You have the best chances of quitting if you use them together: 1. Get ready.  2. Get support and encouragement.  3. Learn new skills and behaviors.  4. Get medicine to reduce your nicotine addiction and use it correctly.  5. Be prepared for relapse or difficult situations. Be determined to continue trying to quit, even if you do not succeed at first.  1. GET READY  Set a quit date.   Change your environment.   Get rid of ALL cigarettes, ashtrays, matches, and lighters in your home, car, and place of work.  Do not let people smoke in your home.   Review your past attempts to quit. Think about what worked and what did not.   Once you quit, do not smoke. NOT EVEN A PUFF!  2. GET SUPPORT AND ENCOURAGEMENT Studies have shown that you have a better chance of being successful if you have help. You can get support in many ways.  Tell your family, friends, and coworkers that you are going to quit and need their support. Ask them not to smoke around you.   Talk to your caregivers (doctor, dentist, nurse, pharmacist, psychologist, and/or smoking counselor).   Get individual, group, or telephone counseling and support. The more counseling you have, the better your chances are of quitting. Programs are available at [employer] and health centers. Call your local health department for information about programs in your area.   Spiritual beliefs and practices may help some smokers  quit.   Quit meters are Insurance underwriter that keep track of quit statistics, such as amount of "quit-time," cigarettes not smoked, and money saved.   Many smokers find one or more of the many self-help books available useful in helping them quit and stay off tobacco.  3. LEARN NEW SKILLS AND BEHAVIORS  Try to distract yourself from urges to smoke. Talk to someone, go for a walk, or occupy your time with a task.   When you first try to quit, change your routine. Take a different route to work. Drink tea instead of coffee. Eat breakfast in a different place.   Do something to reduce your stress. Take a hot bath, exercise, or read a book.   Plan something enjoyable to do every day. Reward yourself for not smoking.   Explore interactive web-based programs that specialize in helping you quit.  4. GET MEDICINE AND USE IT CORRECTLY Medicines can help you stop smoking and decrease the urge to smoke. Combining medicine with the above behavioral methods and support can quadruple your chances of successfully quitting smoking. The [place]. Food and Drug Administration (FDA) has approved 7 medicines to help you quit smoking. These medicines fall into 3 categories.  Nicotine replacement therapy (delivers nicotine to your body without the negative effects and risks of smoking):   Nicotine gum: Available over-the-counter.   Nicotine lozenges: Available over-the-counter.   Nicotine inhaler: Available by prescription.   Nicotine nasal spray: Available by prescription.   Nicotine skin patches (transdermal): Available by prescription and over-the-counter.   Antidepressant medicine (helps people abstain from smoking, but how this works is unknown):   Bupropion sustained-release (SR) tablets: Available by prescription.   Nicotinic receptor partial agonist (simulates the effect of nicotine in your brain):   Varenicline tartrate tablets: Available by prescription.   Ask your  caregiver for advice about which medicines to use and how to use them. Carefully read the information on the package.   Everyone who is trying to quit may benefit from using a medicine. If you are pregnant or trying to become pregnant, nursing an infant, you are under age 43, or you smoke fewer than 10 cigarettes per day, talk to your caregiver before taking any nicotine replacement medicines.   You should stop using a nicotine replacement product and call your caregiver if you experience nausea, dizziness, weakness, vomiting, fast or irregular heartbeat, mouth problems with the lozenge or gum, or redness or swelling of the skin around the patch that does not go away.   Do not use any other product  containing nicotine while using a nicotine replacement product.   Talk to your caregiver before using these products if you have diabetes, heart disease, asthma, stomach ulcers, you had a recent heart attack, you have high blood pressure that is not controlled with medicine, a history of irregular heartbeat, or you have been prescribed medicine to help you quit smoking.  5. BE PREPARED FOR RELAPSE OR DIFFICULT SITUATIONS  Most relapses occur within the first 3 months after quitting. Do not be discouraged if you start smoking again. Remember, most people try several times before they finally quit.   You may have symptoms of withdrawal because your body is used to nicotine. You may crave cigarettes, be irritable, feel very hungry, cough often, get headaches, or have difficulty concentrating.   The withdrawal symptoms are only temporary. They are strongest when you first quit, but they will go away within 10 to 14 days.  Here are some difficult situations to watch for:  Alcohol. Avoid drinking alcohol. Drinking lowers your chances of successfully quitting.   Caffeine. Try to reduce the amount of caffeine you consume. It also lowers your chances of successfully quitting.   Other smokers. Being around  smoking can make you want to smoke. Avoid smokers.   Weight gain. Many smokers will gain weight when they quit, usually less than 10 pounds. Eat a healthy diet and stay active. Do not let weight gain distract you from your main goal, quitting smoking. Some medicines that help you quit smoking may also help delay weight gain. You can always lose the weight gained after you quit.   Bad mood or depression. There are a lot of ways to improve your mood other than smoking.  If you are having problems with any of these situations, talk to your caregiver. SPECIAL SITUATIONS AND CONDITIONS Studies suggest that everyone can quit smoking. Your situation or condition can give you a special reason to quit.  Pregnant women/new mothers: By quitting, you protect your baby's health and your own.   Hospitalized patients: By quitting, you reduce health problems and help healing.   Heart attack patients: By quitting, you reduce your risk of a second heart attack.   Lung, head, and neck cancer patients: By quitting, you reduce your chance of a second cancer.   Parents of children and adolescents: By quitting, you protect your children from illnesses caused by secondhand smoke.  QUESTIONS TO THINK ABOUT Think about the following questions before you try to stop smoking. You may want to talk about your answers with your caregiver.  Why do you want to quit?   If you tried to quit in the past, what helped and what did not?   What will be the most difficult situations for you after you quit? How will you plan to handle them?   Who can help you through the tough times? Your family? Friends? Caregiver?   What pleasures do you get from smoking? What ways can you still get pleasure if you quit?  Here are some questions to ask your caregiver:  How can you help me to be successful at quitting?   What medicine do you think would be best for me and how should I take it?   What should I do if I need more help?    What is smoking withdrawal like? How can I get information on withdrawal?  Quitting takes hard work and a lot of effort, but you can quit smoking.

## 2014-02-25 NOTE — Progress Notes (Signed)
Angelica Dillon Nov 22, 1980 [id number]        33 y.o.  G1P0010 Presents with 2 issues:  1. Vaginal discharge x1 day. No itching or odor. No urinary symptoms such as frequency dysuria or urgency. 2. Smoking cessation. Had tried Chantix twice but did not feel well on it. Friend of hers has tried Wellbutrin and did well with this to stop smoking and she had questions about this.  Past medical history,surgical history, problem list, medications, allergies, family history and social history were all reviewed and documented in the EPIC chart.  Directed ROS with pertinent positives and negatives documented in the history of present illness/assessment and plan.  Exam: Kim assistant General appearance:  Normal Abdomen soft nontender without masses guarding rebound Pelvic external BUS vagina with white frothy discharge. Cervix normal. Uterus normal size and mobile nontender. Adnexa without masses or tenderness.  Assessment/Plan:  33 y.o. G1P0010   1. Vaginal discharge. Exam and wet prep consistent with early bacterial vaginosis. We'll treat with Flagyl 500 mg twice a day x7 days at her request. Alcohol avoidance reviewed. 2. Smoking cessation. We discussed strategies and medications. I reviewed Wellbutrin with her. Off brand labeling indications for regular Wellbutrin discussed. Side effects and risks reviewed up to and including suicide ideation. Need to taper when she discontinues also reviewed with her.  We'll go head and start her on Wellbutrin XL 150 mg daily x4 month supply. No refill. Recommended that if she has not quit after 2-3 months on the medication that she should discontinue it.     Anastasio Auerbach MD, 3:57 PM 02/25/2014

## 2014-02-25 NOTE — Addendum Note (Signed)
Addended by: Nelva Nay on: 02/25/2014 04:36 PM   Modules accepted: Orders

## 2014-02-27 ENCOUNTER — Telehealth: Payer: Self-pay | Admitting: *Deleted

## 2014-02-27 NOTE — Telephone Encounter (Signed)
Pt informed with the below note. 

## 2014-02-27 NOTE — Telephone Encounter (Signed)
Pt is on day 2 of Wellbutrin 150 for smoking, pt said she saw one of the side effects was rash. Pt said she started off with rash on neck and now on face, she asked if she should continue medication for now?  If continues to spread stop? Please advise

## 2014-02-27 NOTE — Telephone Encounter (Signed)
If she is having no other symptoms she can try at another day and if the rash continues and stop it. If she has any other symptoms particularly swelling or asthma-type symptoms and she needs to stop immediately and take Benadryl 50 mg

## 2014-04-06 ENCOUNTER — Encounter: Payer: Self-pay | Admitting: Gynecology

## 2014-05-04 ENCOUNTER — Telehealth: Payer: Self-pay | Admitting: *Deleted

## 2014-05-04 MED ORDER — VARENICLINE TARTRATE 0.5 MG X 11 & 1 MG X 42 PO MISC: ORAL | Status: DC

## 2014-05-04 NOTE — Telephone Encounter (Signed)
Okay for Chantix dispensed one starter pack and then refill maintenance pack 2  Sig:  0.5 mg daily days 1 through 3, 0.5 mg twice a day days 4 through 7 and then 1 mg twice daily

## 2014-05-04 NOTE — Telephone Encounter (Signed)
Pt informed Rx sent. 

## 2014-05-04 NOTE — Telephone Encounter (Signed)
Pt calling to follow up from telephone encounter on 02/27/14. Pt stopped Wellbutrin 150 for smoking because she has another rash outbreak. She asked if she could start back on chantix and just deal with the nausea that comes along with taking chantix? Please advise

## 2014-05-18 ENCOUNTER — Encounter: Payer: Self-pay | Admitting: Gynecology

## 2014-05-18 ENCOUNTER — Ambulatory Visit (INDEPENDENT_AMBULATORY_CARE_PROVIDER_SITE_OTHER): Payer: BC Managed Care – PPO | Admitting: Gynecology

## 2014-05-18 VITALS — BP 100/68

## 2014-05-18 DIAGNOSIS — N760 Acute vaginitis: Secondary | ICD-10-CM

## 2014-05-18 DIAGNOSIS — N9489 Other specified conditions associated with female genital organs and menstrual cycle: Secondary | ICD-10-CM

## 2014-05-18 DIAGNOSIS — A499 Bacterial infection, unspecified: Secondary | ICD-10-CM

## 2014-05-18 DIAGNOSIS — N898 Other specified noninflammatory disorders of vagina: Secondary | ICD-10-CM

## 2014-05-18 DIAGNOSIS — Z113 Encounter for screening for infections with a predominantly sexual mode of transmission: Secondary | ICD-10-CM

## 2014-05-18 DIAGNOSIS — B9689 Other specified bacterial agents as the cause of diseases classified elsewhere: Secondary | ICD-10-CM

## 2014-05-18 LAB — WET PREP FOR TRICH, YEAST, CLUE
TRICH WET PREP: NONE SEEN
YEAST WET PREP: NONE SEEN

## 2014-05-18 MED ORDER — FLUCONAZOLE 150 MG PO TABS: 150.0000 mg | ORAL_TABLET | Freq: Once | ORAL | Status: DC

## 2014-05-18 MED ORDER — TINIDAZOLE 500 MG PO TABS
ORAL_TABLET | ORAL | Status: DC
Start: 2014-05-18 — End: 2014-08-05

## 2014-05-18 NOTE — Patient Instructions (Signed)
Bacterial Vaginosis Bacterial vaginosis is a vaginal infection that occurs when the normal balance of bacteria in the vagina is disrupted. It results from an overgrowth of certain bacteria. This is the most common vaginal infection in women of childbearing age. Treatment is important to prevent complications, especially in pregnant women, as it can cause a premature delivery. CAUSES  Bacterial vaginosis is caused by an increase in harmful bacteria that are normally present in smaller amounts in the vagina. Several different kinds of bacteria can cause bacterial vaginosis. However, the reason that the condition develops is not fully understood. RISK FACTORS Certain activities or behaviors can put you at an increased risk of developing bacterial vaginosis, including:  Having a new sex partner or multiple sex partners.  Douching.  Using an intrauterine device (IUD) for contraception. Women do not get bacterial vaginosis from toilet seats, bedding, swimming pools, or contact with objects around them. SIGNS AND SYMPTOMS  Some women with bacterial vaginosis have no signs or symptoms. Common symptoms include:  Grey vaginal discharge.  A fishlike odor with discharge, especially after sexual intercourse.  Itching or burning of the vagina and vulva.  Burning or pain with urination. DIAGNOSIS  Your health care provider will take a medical history and examine the vagina for signs of bacterial vaginosis. A sample of vaginal fluid may be taken. Your health care provider will look at this sample under a microscope to check for bacteria and abnormal cells. A vaginal pH test may also be done.  TREATMENT  Bacterial vaginosis may be treated with antibiotic medicines. These may be given in the form of a pill or a vaginal cream. A second round of antibiotics may be prescribed if the condition comes back after treatment.  HOME CARE INSTRUCTIONS   Only take over-the-counter or prescription medicines as  directed by your health care provider.  If antibiotic medicine was prescribed, take it as directed. Make sure you finish it even if you start to feel better.  Do not have sex until treatment is completed.  Tell all sexual partners that you have a vaginal infection. They should see their health care provider and be treated if they have problems, such as a mild rash or itching.  Practice safe sex by using condoms and only having one sex partner. SEEK MEDICAL CARE IF:   Your symptoms are not improving after 3 days of treatment.  You have increased discharge or pain.  You have a fever. MAKE SURE YOU:   Understand these instructions.  Will watch your condition.  Will get help right away if you are not doing well or get worse. FOR MORE INFORMATION  Centers for Disease Control and Prevention, Division of STD Prevention: [employer]raiser[employer]raud[employer] American Sexual Health Association (ASHA): www.ashastd.org  Document Released: 05/22/2005 Document Revised: 03/12/2013 Document Reviewed: 01/01/2013 [facility] Patient Information 2015 [place], [place]. This information is not intended to replace advice given to you by your health care provider. Make sure you discuss any questions you have with your health care provider.    Refresh a probiotic gel use intravaginally twice a week and/or after intercourse or menstrual cycle

## 2014-05-18 NOTE — Progress Notes (Signed)
   Patient is a 33 year old who presented to the office today complaining of today history of fishy odor from her vagina. Patient stated that last year she was treated for bacterial vaginosis as well as 6 months ago. She is not using anything for contraception she is having normal menstrual cycles and had an STD screen 6 months ago which was negative and does have a monogamous relationship.  Exam: Bartholin urethra Skene was within normal limits Vagina: Fish like odor clear discharge noted Bimanual exam not done Rectal exam: Not done  Wet prep: Positive amine, moderate clue cells, TNTC bacteria  GC and Chlamydia culture obtained  Assessment/plan: Clinical evidence of bacterial vaginosis. 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 She does not douche. We are going to treat her for 2 weeks with Tindamax 500 mg twice a day. She will also take a daily oral probiotic tablet. I've also recommended that she use refresh a probiotic gel twice a week and/or after intercourse and menses GC and Chlamydia culture obtained results pending at time of this dictation. Patient was informed to use condoms during the time that she's taken to Tindamax.

## 2014-05-19 ENCOUNTER — Ambulatory Visit: Payer: BC Managed Care – PPO | Admitting: Women's Health

## 2014-05-19 LAB — GC/CHLAMYDIA PROBE AMP
CT PROBE, AMP APTIMA: NEGATIVE
GC Probe RNA: NEGATIVE

## 2014-08-05 ENCOUNTER — Telehealth: Payer: Self-pay | Admitting: *Deleted

## 2014-08-05 ENCOUNTER — Ambulatory Visit (INDEPENDENT_AMBULATORY_CARE_PROVIDER_SITE_OTHER): Payer: BC Managed Care – PPO

## 2014-08-05 ENCOUNTER — Ambulatory Visit (INDEPENDENT_AMBULATORY_CARE_PROVIDER_SITE_OTHER): Payer: BC Managed Care – PPO | Admitting: Physician Assistant

## 2014-08-05 VITALS — BP 105/65 | HR 95 | Temp 99.5°F | Resp 16 | Ht 65.0 in | Wt 129.0 lb

## 2014-08-05 DIAGNOSIS — R0989 Other specified symptoms and signs involving the circulatory and respiratory systems: Secondary | ICD-10-CM | POA: Diagnosis not present

## 2014-08-05 DIAGNOSIS — Z3200 Encounter for pregnancy test, result unknown: Secondary | ICD-10-CM

## 2014-08-05 DIAGNOSIS — R52 Pain, unspecified: Secondary | ICD-10-CM | POA: Diagnosis not present

## 2014-08-05 DIAGNOSIS — R509 Fever, unspecified: Secondary | ICD-10-CM | POA: Diagnosis not present

## 2014-08-05 DIAGNOSIS — R6889 Other general symptoms and signs: Secondary | ICD-10-CM

## 2014-08-05 LAB — POCT CBC
Granulocyte percent: 70.2 %G (ref 37–80)
HCT, POC: 38.3 % (ref 37.7–47.9)
Hemoglobin: 12.1 g/dL — AB (ref 12.2–16.2)
Lymph, poc: 1.5 (ref 0.6–3.4)
MCH: 23.1 pg — AB (ref 27–31.2)
MCHC: 31.6 g/dL — AB (ref 31.8–35.4)
MCV: 73.2 fL — AB (ref 80–97)
MID (cbc): 0.8 (ref 0–0.9)
MPV: 7.1 fL (ref 0–99.8)
PLATELET COUNT, POC: 193 10*3/uL (ref 142–424)
POC Granulocyte: 5.3 (ref 2–6.9)
POC LYMPH %: 19.4 % (ref 10–50)
POC MID %: 10.4 % (ref 0–12)
RBC: 5.23 M/uL (ref 4.04–5.48)
RDW, POC: 14.6 %
WBC: 7.5 10*3/uL (ref 4.6–10.2)

## 2014-08-05 LAB — POCT INFLUENZA A/B
INFLUENZA A, POC: NEGATIVE
INFLUENZA B, POC: NEGATIVE

## 2014-08-05 LAB — POCT URINE PREGNANCY: PREG TEST UR: NEGATIVE

## 2014-08-05 MED ORDER — IPRATROPIUM BROMIDE 0.02 % IN SOLN
0.5000 mg | Freq: Once | RESPIRATORY_TRACT | Status: AC
  Administered 2014-08-05: 0.5 mg via RESPIRATORY_TRACT

## 2014-08-05 MED ORDER — OSELTAMIVIR PHOSPHATE 75 MG PO CAPS: 75.0000 mg | ORAL_CAPSULE | Freq: Two times a day (BID) | ORAL | Status: DC

## 2014-08-05 MED ORDER — ALBUTEROL SULFATE (2.5 MG/3ML) 0.083% IN NEBU
2.5000 mg | INHALATION_SOLUTION | Freq: Once | RESPIRATORY_TRACT | Status: AC
  Administered 2014-08-05: 2.5 mg via RESPIRATORY_TRACT

## 2014-08-05 NOTE — Telephone Encounter (Signed)
Pt called c/o this am she woke up with a temp. 101.2 chills, periods of being hot and cold, body aches. I advised patient best to go to urgent care to be seen.

## 2014-08-05 NOTE — Progress Notes (Signed)
Subjective:    Patient ID: Angelica Dillon, female    DOB: 09-23-1980, 34 y.o.   MRN: [id number]  Chief Complaint  Patient presents with  . Fever    x yesterday  . Headache    x yesterday  . Generalized Body Aches    x yesterday  . Cough    x yesterday   Prior to Admission medications   Not on File   Medications, allergies, past medical history, surgical history, family history, social history and problem list reviewed and updated.  HPI  34 yof with no significant pmh presents with fairly sudden onset fever, chills, body aches, ha yest.   Did not receive flu vaccine this year. 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 Yest morning over period of few hrs had generalized aches, subjective fever, shaking chills, and bilateral ha behind eyes. All sx still present today. Took alka seltzer cold and flu throughout day yest. Took temp at 99.4 couple hrs after medication. Mild non prod cough since yest. Denies otalgia, st, abd pain, vomiting, diarrhea. Mild nausea. Head congestion. No known sick contacts but exposed through work.   Review of Systems No cp, sob.     Objective:   Physical Exam  Constitutional: She is oriented to person, place, and time. She appears well-developed and well-nourished.  Non-toxic appearance. She does not have a sickly appearance. She appears ill. No distress.  BP 105/65 mmHg  Pulse 95  Temp(Src) 99.5 F (37.5 C)  Resp 16  Ht 5\' 5"  (1.651 m)  Wt 129 lb (58.514 kg)  BMI 21.47 kg/m2  SpO2 98%  LMP 07/05/2014   HENT:  Right Ear: Tympanic membrane normal.  Left Ear: Tympanic membrane normal.  Nose: Mucosal edema and rhinorrhea present. Right sinus exhibits no maxillary sinus tenderness and no frontal sinus tenderness. Left sinus exhibits no maxillary sinus tenderness and no frontal sinus tenderness.  Mouth/Throat: Uvula is midline, oropharynx is clear and moist and mucous membranes are normal. No oropharyngeal exudate, posterior oropharyngeal  edema, posterior oropharyngeal erythema or tonsillar abscesses.  Neck: No Brudzinski's sign noted.  Cardiovascular: Normal rate, regular rhythm and normal heart sounds.  Exam reveals no gallop.   No murmur heard. Pulmonary/Chest: Effort normal. No tachypnea. She has no decreased breath sounds. She has wheezes in the left upper field and the left middle field. She has rhonchi in the left upper field and the left middle field. She has no rales.  Lymphadenopathy:       Head (right side): Submandibular adenopathy present. No submental and no tonsillar adenopathy present.       Head (left side): Submental and submandibular adenopathy present. No tonsillar adenopathy present.    She has no cervical adenopathy.  Neurological: She is alert and oriented to person, place, and time.  Skin: Skin is warm and dry. No rash noted.  Psychiatric: She has a normal mood and affect. Her speech is normal.   Duoneb in clinic. Breath sounds normal post neb. No wheezes, rales, rhonchi.   [facility] reading (PRIMARY) by  Dr. Lorelei Pont. Findings: Normal.   Results for orders placed or performed in visit on 08/05/14  POCT urine pregnancy  Result Value Ref Range   Preg Test, Ur Negative   POCT CBC  Result Value Ref Range   WBC 7.5 4.6 - 10.2 K/uL   Lymph, poc 1.5 0.6 - 3.4   POC LYMPH PERCENT 19.4 10 - 50 %L   MID (cbc) 0.8 0 -  0.9   POC MID % 10.4 0 - 12 %M   POC Granulocyte 5.3 2 - 6.9   Granulocyte percent 70.2 37 - 80 %G   RBC 5.23 4.04 - 5.48 M/uL   Hemoglobin 12.1 (A) 12.2 - 16.2 g/dL   HCT, POC 38.3 37.7 - 47.9 %   MCV 73.2 (A) 80 - 97 fL   MCH, POC 23.1 (A) 27 - 31.2 pg   MCHC 31.6 (A) 31.8 - 35.4 g/dL   RDW, POC 14.6 %   Platelet Count, POC 193 142 - 424 K/uL   MPV 7.1 0 - 99.8 fL  POCT Influenza A/B  Result Value Ref Range   Influenza A, POC Negative    Influenza B, POC Negative       Assessment & Plan:   34 yof with no significant pmh presents with fairly sudden onset fever, chills, body aches,  ha yest.   Abnormal lung sounds - Plan: DG Chest 2 View, albuterol (PROVENTIL) (2.5 MG/3ML) 0.083% nebulizer solution 2.5 mg, ipratropium (ATROVENT) nebulizer solution 0.5 mg --normal xray, normal vitals --breath sounds normal after duoneb --likely viral bronchitis --> rest, fluids --rtc if start feeling sob, cough/sputum increases  Fever, unspecified fever cause - Plan: POCT CBC, POCT Influenza A/B, DG Chest 2 View Body aches - Plan: POCT CBC, POCT Influenza A/B Flu-like symptoms - Plan: oseltamivir (TAMIFLU) 75 MG capsule --flu swab neg --no leukocytosis --tx for flu with tamiflu as pt has flu-like sx and is in and out of nursing homes daily with her job, pt informed of GI side effects, declines flu vaccine --rest/fluids/tylenol prn  Encounter for pregnancy test - Plan: POCT urine pregnancy --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

## 2014-08-05 NOTE — Patient Instructions (Signed)
Your flu swab was negative. Your chest xray looked normal. You don't have an elevated white blood cell count.  We will treat you for flu anyway with tamiflu. Please take twice daily for 5 days. Please return to clinic if you're not feeling better in 4-5 days or if your cough worsens or becomes more productive.

## 2014-11-24 ENCOUNTER — Encounter: Payer: Self-pay | Admitting: Women's Health

## 2014-11-24 ENCOUNTER — Ambulatory Visit (INDEPENDENT_AMBULATORY_CARE_PROVIDER_SITE_OTHER): Payer: BC Managed Care – PPO | Admitting: Women's Health

## 2014-11-24 VITALS — BP 118/80 | Ht 64.0 in | Wt 125.0 lb

## 2014-11-24 DIAGNOSIS — A499 Bacterial infection, unspecified: Secondary | ICD-10-CM

## 2014-11-24 DIAGNOSIS — N9489 Other specified conditions associated with female genital organs and menstrual cycle: Secondary | ICD-10-CM

## 2014-11-24 DIAGNOSIS — N898 Other specified noninflammatory disorders of vagina: Secondary | ICD-10-CM

## 2014-11-24 DIAGNOSIS — N760 Acute vaginitis: Secondary | ICD-10-CM | POA: Diagnosis not present

## 2014-11-24 DIAGNOSIS — N926 Irregular menstruation, unspecified: Secondary | ICD-10-CM | POA: Diagnosis not present

## 2014-11-24 DIAGNOSIS — B9689 Other specified bacterial agents as the cause of diseases classified elsewhere: Secondary | ICD-10-CM

## 2014-11-24 LAB — URINALYSIS W MICROSCOPIC + REFLEX CULTURE
Bilirubin Urine: NEGATIVE
Casts: NONE SEEN
Crystals: NONE SEEN
Glucose, UA: NEGATIVE mg/dL
Ketones, ur: NEGATIVE mg/dL
Leukocytes, UA: NEGATIVE
Nitrite: NEGATIVE
PROTEIN: NEGATIVE mg/dL
SPECIFIC GRAVITY, URINE: 1.025 (ref 1.005–1.030)
UROBILINOGEN UA: 0.2 mg/dL (ref 0.0–1.0)
WBC, UA: NONE SEEN WBC/hpf (ref <3)
pH: 5.5 (ref 5.0–8.0)

## 2014-11-24 LAB — WET PREP FOR TRICH, YEAST, CLUE
Trich, Wet Prep: NONE SEEN
Yeast Wet Prep HPF POC: NONE SEEN

## 2014-11-24 MED ORDER — METRONIDAZOLE 500 MG PO TABS: 500.0000 mg | ORAL_TABLET | Freq: Two times a day (BID) | ORAL | Status: DC

## 2014-11-24 NOTE — Patient Instructions (Signed)
Bacterial Vaginosis Bacterial vaginosis is a vaginal infection that occurs when the normal balance of bacteria in the vagina is disrupted. It results from an overgrowth of certain bacteria. This is the most common vaginal infection in women of childbearing age. Treatment is important to prevent complications, especially in pregnant women, as it can cause a premature delivery. CAUSES  Bacterial vaginosis is caused by an increase in harmful bacteria that are normally present in smaller amounts in the vagina. Several different kinds of bacteria can cause bacterial vaginosis. However, the reason that the condition develops is not fully understood. RISK FACTORS Certain activities or behaviors can put you at an increased risk of developing bacterial vaginosis, including:  Having a new sex partner or multiple sex partners.  Douching.  Using an intrauterine device (IUD) for contraception. Women do not get bacterial vaginosis from toilet seats, bedding, swimming pools, or contact with objects around them. SIGNS AND SYMPTOMS  Some women with bacterial vaginosis have no signs or symptoms. Common symptoms include:  Grey vaginal discharge.  A fishlike odor with discharge, especially after sexual intercourse.  Itching or burning of the vagina and vulva.  Burning or pain with urination. DIAGNOSIS  Your health care provider will take a medical history and examine the vagina for signs of bacterial vaginosis. A sample of vaginal fluid may be taken. Your health care provider will look at this sample under a microscope to check for bacteria and abnormal cells. A vaginal pH test may also be done.  TREATMENT  Bacterial vaginosis may be treated with antibiotic medicines. These may be given in the form of a pill or a vaginal cream. A second round of antibiotics may be prescribed if the condition comes back after treatment.  HOME CARE INSTRUCTIONS   Only take over-the-counter or prescription medicines as  directed by your health care provider.  If antibiotic medicine was prescribed, take it as directed. Make sure you finish it even if you start to feel better.  Do not have sex until treatment is completed.  Tell all sexual partners that you have a vaginal infection. They should see their health care provider and be treated if they have problems, such as a mild rash or itching.  Practice safe sex by using condoms and only having one sex partner. SEEK MEDICAL CARE IF:   Your symptoms are not improving after 3 days of treatment.  You have increased discharge or pain.  You have a fever. MAKE SURE YOU:   Understand these instructions.  Will watch your condition.  Will get help right away if you are not doing well or get worse. FOR MORE INFORMATION  Centers for Disease Control and Prevention, Division of STD Prevention: www.cdc.gov/std American Sexual Health Association (ASHA): www.ashastd.org  Document Released: 05/22/2005 Document Revised: 03/12/2013 Document Reviewed: 01/01/2013 [employer] Patient Information 2015 [employer]. This information is not intended to replace advice given to you by your health care provider. Make sure you discuss any questions you have with your health care provider.  

## 2014-11-24 NOTE — Progress Notes (Signed)
Patient ID: Angelica Dillon, female   DOB: 1981-02-10, 34 y.o.   MRN: [id number] Presents with vaginal discharge with odor and irritation. Cycle  late and has only been spotting for the past 2 weeks. Using no contraception for greater than 6 months desiring pregnancy. Denies abdominal pain, urinary symptoms or fever. Negative STD screen with current partner. Monthly cycle, occasionally skips a month. Pregnancy with SAB at age 50.  Exam: Appears well. Abdomen soft without rebound or radiation of pain with palpation. External genitalia within normal limits, speculum exam moderate amount of a pink adherent discharge with odor, wet prep positive for amines, clues, TNTC bacteria. Bimanual uterus small nontender no adnexal tenderness or fullness.  Bacteria vaginosis Irregular bleeding  Plan: Qualitative hCG. If negative withdrawal with Provera 10 for 10 days. Prenatal vitamin daily encouraged. Flagyl 500 twice daily for 7 days #14 alcohol precautions reviewed.

## 2014-11-25 ENCOUNTER — Other Ambulatory Visit: Payer: Self-pay | Admitting: Women's Health

## 2014-11-25 LAB — HCG, SERUM, QUALITATIVE: Preg, Serum: NEGATIVE

## 2014-11-25 MED ORDER — MEDROXYPROGESTERONE ACETATE 10 MG PO TABS: 10.0000 mg | ORAL_TABLET | Freq: Every day | ORAL | Status: DC

## 2014-11-27 ENCOUNTER — Telehealth: Payer: Self-pay | Admitting: *Deleted

## 2014-11-27 ENCOUNTER — Ambulatory Visit: Payer: BC Managed Care – PPO | Admitting: Women's Health

## 2014-11-27 NOTE — Telephone Encounter (Signed)
Pt had questions about OV on 11/24/14 regarding provera, and spotting, all questions answered. Pt will follow up if needed, cycles do no regulate

## 2015-01-22 ENCOUNTER — Ambulatory Visit (INDEPENDENT_AMBULATORY_CARE_PROVIDER_SITE_OTHER): Payer: BC Managed Care – PPO | Admitting: Gynecology

## 2015-01-22 ENCOUNTER — Encounter: Payer: Self-pay | Admitting: Gynecology

## 2015-01-22 VITALS — BP 116/74 | Ht 65.0 in | Wt 122.0 lb

## 2015-01-22 DIAGNOSIS — Z01419 Encounter for gynecological examination (general) (routine) without abnormal findings: Secondary | ICD-10-CM | POA: Diagnosis not present

## 2015-01-22 DIAGNOSIS — N760 Acute vaginitis: Secondary | ICD-10-CM | POA: Diagnosis not present

## 2015-01-22 DIAGNOSIS — Z113 Encounter for screening for infections with a predominantly sexual mode of transmission: Secondary | ICD-10-CM

## 2015-01-22 DIAGNOSIS — A499 Bacterial infection, unspecified: Secondary | ICD-10-CM | POA: Diagnosis not present

## 2015-01-22 DIAGNOSIS — N926 Irregular menstruation, unspecified: Secondary | ICD-10-CM | POA: Diagnosis not present

## 2015-01-22 DIAGNOSIS — N898 Other specified noninflammatory disorders of vagina: Secondary | ICD-10-CM | POA: Diagnosis not present

## 2015-01-22 DIAGNOSIS — B9689 Other specified bacterial agents as the cause of diseases classified elsewhere: Secondary | ICD-10-CM

## 2015-01-22 LAB — CBC WITH DIFFERENTIAL/PLATELET
Basophils Absolute: 0.1 10*3/uL (ref 0.0–0.1)
Basophils Relative: 1 % (ref 0–1)
Eosinophils Absolute: 0.1 10*3/uL (ref 0.0–0.7)
Eosinophils Relative: 2 % (ref 0–5)
HCT: 40 % (ref 36.0–46.0)
Hemoglobin: 13 g/dL (ref 12.0–15.0)
Lymphocytes Relative: 37 % (ref 12–46)
Lymphs Abs: 2.3 10*3/uL (ref 0.7–4.0)
MCH: 23 pg — ABNORMAL LOW (ref 26.0–34.0)
MCHC: 32.5 g/dL (ref 30.0–36.0)
MCV: 70.9 fL — ABNORMAL LOW (ref 78.0–100.0)
MONO ABS: 0.7 10*3/uL (ref 0.1–1.0)
MPV: 9.7 fL (ref 8.6–12.4)
Monocytes Relative: 11 % (ref 3–12)
NEUTROS ABS: 3.1 10*3/uL (ref 1.7–7.7)
Neutrophils Relative %: 49 % (ref 43–77)
Platelets: 285 10*3/uL (ref 150–400)
RBC: 5.64 MIL/uL — ABNORMAL HIGH (ref 3.87–5.11)
RDW: 15.7 % — AB (ref 11.5–15.5)
WBC: 6.3 10*3/uL (ref 4.0–10.5)

## 2015-01-22 LAB — COMPREHENSIVE METABOLIC PANEL
ALT: 25 U/L (ref 6–29)
AST: 22 U/L (ref 10–30)
Albumin: 4.6 g/dL (ref 3.6–5.1)
Alkaline Phosphatase: 61 U/L (ref 33–115)
BILIRUBIN TOTAL: 0.7 mg/dL (ref 0.2–1.2)
BUN: 12 mg/dL (ref 7–25)
CO2: 21 mmol/L (ref 20–31)
CREATININE: 0.79 mg/dL (ref 0.50–1.10)
Calcium: 9.5 mg/dL (ref 8.6–10.2)
Chloride: 102 mmol/L (ref 98–110)
GLUCOSE: 76 mg/dL (ref 65–99)
Potassium: 4.4 mmol/L (ref 3.5–5.3)
Sodium: 137 mmol/L (ref 135–146)
Total Protein: 7.8 g/dL (ref 6.1–8.1)

## 2015-01-22 LAB — WET PREP FOR TRICH, YEAST, CLUE
Trich, Wet Prep: NONE SEEN
YEAST WET PREP: NONE SEEN

## 2015-01-22 LAB — TSH: TSH: 1.077 u[IU]/mL (ref 0.350–4.500)

## 2015-01-22 LAB — LIPID PANEL
Cholesterol: 166 mg/dL (ref 125–200)
HDL: 76 mg/dL (ref >=46)
LDL CALC: 75 mg/dL (ref <130)
Total CHOL/HDL Ratio: 2.2 Ratio (ref <=5.0)
Triglycerides: 74 mg/dL (ref <150)
VLDL: 15 mg/dL (ref <30)

## 2015-01-22 MED ORDER — METRONIDAZOLE 500 MG PO TABS: 500.0000 mg | ORAL_TABLET | Freq: Two times a day (BID) | ORAL | Status: DC

## 2015-01-22 NOTE — Progress Notes (Signed)
Angelica Dillon Oct 01, 1980 [id number]        34 y.o.  G1P0010 for annual exam.  Several issues noted below  Past medical history,surgical history, problem list, medications, allergies, family history and social history were all reviewed and documented as reviewed in the EPIC chart.  ROS:  Performed with pertinent positives and negatives included in the history, assessment and plan.   Additional significant findings :  none   Exam: Kim Counsellor Vitals:   01/22/15 0818  BP: 116/74  Height: 5\' 5"  (1.651 m)  Weight: 122 lb (55.339 kg)   General appearance:  Normal affect, orientation and appearance. Skin: Grossly normal HEENT: Without gross lesions.  No cervical or supraclavicular adenopathy. Thyroid normal.  Lungs:  Clear without wheezing, rales or rhonchi Cardiac: RR, without RMG Abdominal:  Soft, nontender, without masses, guarding, rebound, organomegaly or hernia Breasts:  Examined lying and sitting without masses, retractions, discharge or axillary adenopathy. Pelvic:  Ext/BUS/vagina with white discharge.  Cervix normal. GC/Chlamydia screen  Uterus anteverted, normal size, shape and contour, midline and mobile nontender   Adnexa  Without masses or tenderness    Anus and perineum  Normal   Rectovaginal  Normal sphincter tone without palpated masses or tenderness.    Assessment/Plan:  34 y.o. G60P0010 female for annual exam with irregular menses, abstinent birth control.   1. Irregular menses. Patient notes menses are somewhat irregular which she will have a period once a month and sometimes up to every other month.  No prolonged or atypical bleeding. Took a Provera withdrawal last month. We'll check baseline FSH TSH prolactin and hCG. Will plan Provera withdrawal if without menses every 8 weeks.  She will call for prescription if needed. 2. Contraception. I again reviewed contraceptive options. Continues to smoke at age 23. Strongly recommend that she consider IUD.  She is  not sexually active at this point and declines any contraceptive discussion. She is actually thinking about donor sperm pregnancy sometime over the next several years. 3. Vaginal discharge with odor of the last several days.  Exam and wet prep is consistent with bacterial vaginosis. Flagyl 500 mg twice a day 7 days, alcohol avoidance reviewed. 4. Pap smear/HPV negative  2015. No Pap smear done today.  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 Normal Pap smears since then. 5. Mammographic recommendations between 35 and 40 reviewed. No strong family history of breast cancer. Patient prefers waiting until age 33. SBE monthly reviewed. 6. Stop smoking strategies reviewed and encouraged. 7. Health maintenance. Baseline CBC comprehensive metabolic panel lipid profile urinalysis ordered.  Follow up in one year, sooner as needed.   Anastasio Auerbach MD, 8:37 AM 01/22/2015

## 2015-01-22 NOTE — Patient Instructions (Signed)
Consider Stop Smoking.  Help is available at [facility]'s smoking cessation program @ [email or web address] or 336-832-0838. OR 1-800-QUIT-NOW (1-800-784-8669) for free smoking cessation counseling.  Smokefree.gov (http://www.smokefree.gov) provides free, accurate, evidence-based information and professional assistance to help support the immediate and long-term needs of people trying to quit smoking.    Smoking Hazards Smoking cigarettes is extremely bad for your health. Tobacco smoke has over 200 known poisons in it. There are over 60 chemicals in tobacco smoke that cause cancer. Some of the chemicals found in cigarette smoke include:  Cyanide.  Benzene.  Formaldehyde.  Methanol (wood alcohol).  Acetylene (fuel used in welding torches).  Ammonia.  Cigarette smoke also contains the poisonous gases nitrogen oxide and carbon monoxide.  Cigarette smokers have an increased risk of many serious medical problems, including: Lung cancer.  Lung disease (such as pneumonia, bronchitis, and emphysema).  Heart attack and chest pain due to the heart not getting enough oxygen (angina).  Heart disease and peripheral blood vessel disease.  Hypertension.  Stroke.  Oral cancer (cancer of the lip, mouth, or voice box).  Bladder cancer.  Pancreatic cancer.  Cervical cancer.  Pregnancy complications, including premature birth.  Low birthweight babies.  Early menopause.  Lower estrogen level for women.  Infertility.  Facial wrinkles.  Blindness.  Increased risk of broken bones (fractures).  Senile dementia.  Stillbirths and smaller newborn babies, birth defects, and genetic damage to sperm.  Stomach ulcers and internal bleeding.  Children of smokers have an increased risk of the following, because of secondhand smoke exposure:  Sudden infant death syndrome (SIDS).  Respiratory infections.  Lung cancer.  Heart disease.  Ear infections.  Smoking causes approximately: 90% of all lung cancer  deaths in men.  80% of all lung cancer deaths in women.  90% of deaths from chronic obstructive lung disease.  Compared with nonsmokers, smoking increases the risk of: Coronary heart disease by 2 to 4 times.  Stroke by 2 to 4 times.  Men developing lung cancer by 23 times.  Women developing lung cancer by 13 times.  Dying from chronic obstructive lung diseases by 12 times.  Someone who smokes 2 packs a day loses about 8 years of his or her life. Even smoking lightly shortens your life expectancy by several years. You can greatly reduce the risk of medical problems for you and your family by stopping now. Smoking is the most preventable cause of death and disease in our society. Within days of quitting smoking, your circulation returns to normal, you decrease the risk of having a heart attack, and your lung capacity improves. There may be some increased phlegm in the first few days after quitting, and it may take months for your lungs to clear up completely. Quitting for 10 years cuts your lung cancer risk to almost that of a nonsmoker. WHY IS SMOKING ADDICTIVE? Nicotine is the chemical agent in tobacco that is capable of causing addiction or dependence.  When you smoke and inhale, nicotine is absorbed rapidly into the bloodstream through your lungs. Nicotine absorbed through the lungs is capable of creating a powerful addiction. Both inhaled and non-inhaled nicotine may be addictive.  Addiction studies of cigarettes and spit tobacco show that addiction to nicotine occurs mainly during the teen years, when young people begin using tobacco products.  WHAT ARE THE BENEFITS OF QUITTING?  There are many health benefits to quitting smoking.  Likelihood of developing cancer and heart disease decreases. Health improvements are seen almost immediately.    Blood pressure, pulse rate, and breathing patterns start returning to normal soon after quitting.  People who quit may see an improvement in their overall  quality of life.  Some people choose to quit all at once. Other options include nicotine replacement products, such as patches, gum, and nasal sprays. Do not use these products without first checking with your caregiver. QUITTING SMOKING It is not easy to quit smoking. Nicotine is addicting, and longtime habits are hard to change. To start, you can write down all your reasons for quitting, tell your family and friends you want to quit, and ask for their help. Throw your cigarettes away, chew gum or cinnamon sticks, keep your hands busy, and drink extra water or juice. Go for walks and practice deep breathing to relax. Think of all the money you are saving: around $1,000 a year, for the average pack-a-day smoker. Nicotine patches and gum have been shown to improve success at efforts to stop smoking. Zyban (bupropion) is an anti-depressant drug that can be prescribed to reduce nicotine withdrawal symptoms and to suppress the urge to smoke. Smoking is an addiction with both physical and psychological effects. Joining a stop-smoking support group can help you cope with the emotional issues. For more information and advice on programs to stop smoking, call your doctor, your local hospital, or these organizations: American [employer] Association - 1-800-LUNGUSA   Smoking Cessation  This document explains the best ways for you to quit smoking and new treatments to help. It lists new medicines that can double or triple your chances of quitting and quitting for good. It also considers ways to avoid relapses and concerns you may have about quitting, including weight gain. NICOTINE: A POWERFUL ADDICTION If you have tried to quit smoking, you know how hard it can be. It is hard because nicotine is a very addictive drug. For some people, it can be as addictive as heroin or cocaine. Usually, people make 2 or 3 tries, or more, before finally being able to quit. Each time you try to quit, you can learn about what helps and  what hurts. Quitting takes hard work and a lot of effort, but you can quit smoking. QUITTING SMOKING IS ONE OF THE MOST IMPORTANT THINGS YOU WILL EVER DO.  You will live longer, feel better, and live better.   The impact on your body of quitting smoking is felt almost immediately:   Within 20 minutes, blood pressure decreases. Pulse returns to its normal level.   After 8 hours, carbon monoxide levels in the blood return to normal. Oxygen level increases.   After 24 hours, chance of heart attack starts to decrease. Breath, hair, and body stop smelling like smoke.   After 48 hours, damaged nerve endings begin to recover. Sense of taste and smell improve.   After 72 hours, the body is virtually free of nicotine. Bronchial tubes relax and breathing becomes easier.   After 2 to 12 weeks, lungs can hold more air. Exercise becomes easier and circulation improves.   Quitting will reduce your risk of having a heart attack, stroke, cancer, or lung disease:   After 1 year, the risk of coronary heart disease is cut in half.   After 5 years, the risk of stroke falls to the same as a nonsmoker.   After 10 years, the risk of lung cancer is cut in half and the risk of other cancers decreases significantly.   After 15 years, the risk of coronary heart disease drops, usually   to the level of a nonsmoker.   If you are pregnant, quitting smoking will improve your chances of having a healthy baby.   The people you live with, especially your children, will be healthier.   You will have extra money to spend on things other than cigarettes.  FIVE KEYS TO QUITTING Studies have shown that these 5 steps will help you quit smoking and quit for good. You have the best chances of quitting if you use them together:  Get ready.   Get support and encouragement.   Learn new skills and behaviors.   Get medicine to reduce your nicotine addiction and use it correctly.   Be prepared for relapse or difficult  situations. Be determined to continue trying to quit, even if you do not succeed at first.  1. GET READY  Set a quit date.   Change your environment.   Get rid of ALL cigarettes, ashtrays, matches, and lighters in your home, car, and place of work.   Do not let people smoke in your home.   Review your past attempts to quit. Think about what worked and what did not.   Once you quit, do not smoke. NOT EVEN A PUFF!  2. GET SUPPORT AND ENCOURAGEMENT Studies have shown that you have a better chance of being successful if you have help. You can get support in many ways.  Tell your family, friends, and coworkers that you are going to quit and need their support. Ask them not to smoke around you.   Talk to your caregivers (doctor, dentist, nurse, pharmacist, psychologist, and/or smoking counselor).   Get individual, group, or telephone counseling and support. The more counseling you have, the better your chances are of quitting. Programs are available at local hospitals and health centers. Call your local health department for information about programs in your area.   Spiritual beliefs and practices may help some smokers quit.   Quit meters are small computer programs online or downloadable that keep track of quit statistics, such as amount of "quit-time," cigarettes not smoked, and money saved.   Many smokers find one or more of the many self-help books available useful in helping them quit and stay off tobacco.  3. LEARN NEW SKILLS AND BEHAVIORS  Try to distract yourself from urges to smoke. Talk to someone, go for a walk, or occupy your time with a task.   When you first try to quit, change your routine. Take a different route to work. Drink tea instead of coffee. Eat breakfast in a different place.   Do something to reduce your stress. Take a hot bath, exercise, or read a book.   Plan something enjoyable to do every day. Reward yourself for not smoking.   Explore interactive  web-based programs that specialize in helping you quit.  4. GET MEDICINE AND USE IT CORRECTLY Medicines can help you stop smoking and decrease the urge to smoke. Combining medicine with the above behavioral methods and support can quadruple your chances of successfully quitting smoking. The [place] Food and Drug Administration (FDA) has approved 7 medicines to help you quit smoking. These medicines fall into 3 categories.  Nicotine replacement therapy (delivers nicotine to your body without the negative effects and risks of smoking):   Nicotine gum: Available over-the-counter.   Nicotine lozenges: Available over-the-counter.   Nicotine inhaler: Available by prescription.   Nicotine nasal spray: Available by prescription.   Nicotine skin patches (transdermal): Available by prescription and over-the-counter.   Antidepressant medicine (  helps people abstain from smoking, but how this works is unknown):   Bupropion sustained-release (SR) tablets: Available by prescription.   Nicotinic receptor partial agonist (simulates the effect of nicotine in your brain):   Varenicline tartrate tablets: Available by prescription.   Ask your caregiver for advice about which medicines to use and how to use them. Carefully read the information on the package.   Everyone who is trying to quit may benefit from using a medicine. If you are pregnant or trying to become pregnant, nursing an infant, you are under age 18, or you smoke fewer than 10 cigarettes per day, talk to your caregiver before taking any nicotine replacement medicines.   You should stop using a nicotine replacement product and call your caregiver if you experience nausea, dizziness, weakness, vomiting, fast or irregular heartbeat, mouth problems with the lozenge or gum, or redness or swelling of the skin around the patch that does not go away.   Do not use any other product containing nicotine while using a nicotine replacement product.   Talk  to your caregiver before using these products if you have diabetes, heart disease, asthma, stomach ulcers, you had a recent heart attack, you have high blood pressure that is not controlled with medicine, a history of irregular heartbeat, or you have been prescribed medicine to help you quit smoking.  5. BE PREPARED FOR RELAPSE OR DIFFICULT SITUATIONS  Most relapses occur within the first 3 months after quitting. Do not be discouraged if you start smoking again. Remember, most people try several times before they finally quit.   You may have symptoms of withdrawal because your body is used to nicotine. You may crave cigarettes, be irritable, feel very hungry, cough often, get headaches, or have difficulty concentrating.   The withdrawal symptoms are only temporary. They are strongest when you first quit, but they will go away within 10 to 14 days.  Here are some difficult situations to watch for:  Alcohol. Avoid drinking alcohol. Drinking lowers your chances of successfully quitting.   Caffeine. Try to reduce the amount of caffeine you consume. It also lowers your chances of successfully quitting.   Other smokers. Being around smoking can make you want to smoke. Avoid smokers.   Weight gain. Many smokers will gain weight when they quit, usually less than 10 pounds. Eat a healthy diet and stay active. Do not let weight gain distract you from your main goal, quitting smoking. Some medicines that help you quit smoking may also help delay weight gain. You can always lose the weight gained after you quit.   Bad mood or depression. There are a lot of ways to improve your mood other than smoking.  If you are having problems with any of these situations, talk to your caregiver. SPECIAL SITUATIONS AND CONDITIONS Studies suggest that everyone can quit smoking. Your situation or condition can give you a special reason to quit.  Pregnant women/new mothers: By quitting, you protect your baby's health and  your own.   Hospitalized patients: By quitting, you reduce health problems and help healing.   Heart attack patients: By quitting, you reduce your risk of a second heart attack.   Lung, head, and neck cancer patients: By quitting, you reduce your chance of a second cancer.   Parents of children and adolescents: By quitting, you protect your children from illnesses caused by secondhand smoke.  QUESTIONS TO THINK ABOUT Think about the following questions before you try to stop smoking. You may   want to talk about your answers with your caregiver.  Why do you want to quit?   If you tried to quit in the past, what helped and what did not?   What will be the most difficult situations for you after you quit? How will you plan to handle them?   Who can help you through the tough times? Your family? Friends? Caregiver?   What pleasures do you get from smoking? What ways can you still get pleasure if you quit?  Here are some questions to ask your caregiver:  How can you help me to be successful at quitting?   What medicine do you think would be best for me and how should I take it?   What should I do if I need more help?   What is smoking withdrawal like? How can I get information on withdrawal?  Quitting takes hard work and a lot of effort, but you can quit smoking.  You may obtain a copy of any labs that were done today by logging onto MyChart as outlined in the instructions provided with your AVS (after visit summary). The office will not call with normal lab results but certainly if there are any significant abnormalities then we will contact you.   Health Maintenance Adopting a healthy lifestyle and getting preventive care can go a long way to promote health and wellness. Talk with your health care provider about what schedule of regular examinations is right for you. This is a good chance for you to check in with your provider about disease prevention and staying healthy. In between  checkups, there are plenty of things you can do on your own. Experts have done a lot of research about which lifestyle changes and preventive measures are most likely to keep you healthy. Ask your health care provider for more information. WEIGHT AND DIET  Eat a healthy diet  Be sure to include plenty of vegetables, fruits, low-fat dairy products, and lean protein.  Do not eat a lot of foods high in solid fats, added sugars, or salt.  Get regular exercise. This is one of the most important things you can do for your health.  Most adults should exercise for at least 150 minutes each week. The exercise should increase your heart rate and make you sweat (moderate-intensity exercise).  Most adults should also do strengthening exercises at least twice a week. This is in addition to the moderate-intensity exercise.  Maintain a healthy weight  Body mass index (BMI) is a measurement that can be used to identify possible weight problems. It estimates body fat based on height and weight. Your health care provider can help determine your BMI and help you achieve or maintain a healthy weight.  For females 20 years of age and older:   A BMI below 18.5 is considered underweight.  A BMI of 18.5 to 24.9 is normal.  A BMI of 25 to 29.9 is considered overweight.  A BMI of 30 and above is considered obese.  Watch levels of cholesterol and blood lipids  You should start having your blood tested for lipids and cholesterol at 36 years of age, then have this test every 5 years.  You may need to have your cholesterol levels checked more often if:  Your lipid or cholesterol levels are high.  You are older than 36 years of age.  You are at high risk for heart disease.  CANCER SCREENING   Lung Cancer  Lung cancer screening is recommended for   adults 55-80 years old who are at high risk for lung cancer because of a history of smoking.  A yearly low-dose CT scan of the lungs is recommended for  people who:  Currently smoke.  Have quit within the past 15 years.  Have at least a 30-pack-year history of smoking. A pack year is smoking an average of one pack of cigarettes a day for 1 year.  Yearly screening should continue until it has been 15 years since you quit.  Yearly screening should stop if you develop a health problem that would prevent you from having lung cancer treatment.  Breast Cancer  Practice breast self-awareness. This means understanding how your breasts normally appear and feel.  It also means doing regular breast self-exams. Let your health care provider know about any changes, no matter how small.  If you are in your 20s or 30s, you should have a clinical breast exam (CBE) by a health care provider every 1-3 years as part of a regular health exam.  If you are 40 or older, have a CBE every year. Also consider having a breast X-ray (mammogram) every year.  If you have a family history of breast cancer, talk to your health care provider about genetic screening.  If you are at high risk for breast cancer, talk to your health care provider about having an MRI and a mammogram every year.  Breast cancer gene (BRCA) assessment is recommended for women who have family members with BRCA-related cancers. BRCA-related cancers include:  Breast.  Ovarian.  Tubal.  Peritoneal cancers.  Results of the assessment will determine the need for genetic counseling and BRCA1 and BRCA2 testing. Cervical Cancer Routine pelvic examinations to screen for cervical cancer are no longer recommended for nonpregnant women who are considered low risk for cancer of the pelvic organs (ovaries, uterus, and vagina) and who do not have symptoms. A pelvic examination may be necessary if you have symptoms including those associated with pelvic infections. Ask your health care provider if a screening pelvic exam is right for you.   The Pap test is the screening test for cervical cancer for  women who are considered at risk.  If you had a hysterectomy for a problem that was not cancer or a condition that could lead to cancer, then you no longer need Pap tests.  If you are older than 65 years, and you have had normal Pap tests for the past 10 years, you no longer need to have Pap tests.  If you have had past treatment for cervical cancer or a condition that could lead to cancer, you need Pap tests and screening for cancer for at least 20 years after your treatment.  If you no longer get a Pap test, assess your risk factors if they change (such as having a new sexual partner). This can affect whether you should start being screened again.  Some women have medical problems that increase their chance of getting cervical cancer. If this is the case for you, your health care provider may recommend more frequent screening and Pap tests.  The human papillomavirus (HPV) test is another test that may be used for cervical cancer screening. The HPV test looks for the virus that can cause cell changes in the cervix. The cells collected during the Pap test can be tested for HPV.  The HPV test can be used to screen women 30 years of age and older. Getting tested for HPV can extend the interval between normal Pap   tests from three to five years.  An HPV test also should be used to screen women of any age who have unclear Pap test results.  After 36 years of age, women should have HPV testing as often as Pap tests.  Colorectal Cancer  This type of cancer can be detected and often prevented.  Routine colorectal cancer screening usually begins at 36 years of age and continues through 36 years of age.  Your health care provider may recommend screening at an earlier age if you have risk factors for colon cancer.  Your health care provider may also recommend using home test kits to check for hidden blood in the stool.  A small camera at the end of a tube can be used to examine your colon directly  (sigmoidoscopy or colonoscopy). This is done to check for the earliest forms of colorectal cancer.  Routine screening usually begins at age 50.  Direct examination of the colon should be repeated every 5-10 years through 36 years of age. However, you may need to be screened more often if early forms of precancerous polyps or small growths are found. Skin Cancer  Check your skin from head to toe regularly.  Tell your health care provider about any new moles or changes in moles, especially if there is a change in a mole's shape or color.  Also tell your health care provider if you have a mole that is larger than the size of a pencil eraser.  Always use sunscreen. Apply sunscreen liberally and repeatedly throughout the day.  Protect yourself by wearing long sleeves, pants, a wide-brimmed hat, and sunglasses whenever you are outside. HEART DISEASE, DIABETES, AND HIGH BLOOD PRESSURE   Have your blood pressure checked at least every 1-2 years. High blood pressure causes heart disease and increases the risk of stroke.  If you are between 55 years and 79 years old, ask your health care provider if you should take aspirin to prevent strokes.  Have regular diabetes screenings. This involves taking a blood sample to check your fasting blood sugar level.  If you are at a normal weight and have a low risk for diabetes, have this test once every three years after 36 years of age.  If you are overweight and have a high risk for diabetes, consider being tested at a younger age or more often. PREVENTING INFECTION  Hepatitis B  If you have a higher risk for hepatitis B, you should be screened for this virus. You are considered at high risk for hepatitis B if:  You were born in a country where hepatitis B is common. Ask your health care provider which countries are considered high risk.  Your parents were born in a high-risk country, and you have not been immunized against hepatitis B (hepatitis B  vaccine).  You have HIV or AIDS.  You use needles to inject street drugs.  You live with someone who has hepatitis B.  You have had sex with someone who has hepatitis B.  You get hemodialysis treatment.  You take certain medicines for conditions, including cancer, organ transplantation, and autoimmune conditions. Hepatitis C  Blood testing is recommended for:  Everyone born from 1945 through 1965.  Anyone with known risk factors for hepatitis C. Sexually transmitted infections (STIs)  You should be screened for sexually transmitted infections (STIs) including gonorrhea and chlamydia if:  You are sexually active and are younger than 36 years of age.  You are older than 36 years of age and   your health care provider tells you that you are at risk for this type of infection.  Your sexual activity has changed since you were last screened and you are at an increased risk for chlamydia or gonorrhea. Ask your health care provider if you are at risk.  If you do not have HIV, but are at risk, it may be recommended that you take a prescription medicine daily to prevent HIV infection. This is called pre-exposure prophylaxis (PrEP). You are considered at risk if:  You are sexually active and do not regularly use condoms or know the HIV status of your partner(s).  You take drugs by injection.  You are sexually active with a partner who has HIV. Talk with your health care provider about whether you are at high risk of being infected with HIV. If you choose to begin PrEP, you should first be tested for HIV. You should then be tested every 3 months for as long as you are taking PrEP.  PREGNANCY   If you are premenopausal and you may become pregnant, ask your health care provider about preconception counseling.  If you may become pregnant, take 400 to 800 micrograms (mcg) of folic acid every day.  If you want to prevent pregnancy, talk to your health care provider about birth control  (contraception). OSTEOPOROSIS AND MENOPAUSE   Osteoporosis is a disease in which the bones lose minerals and strength with aging. This can result in serious bone fractures. Your risk for osteoporosis can be identified using a bone density scan.  If you are 65 years of age or older, or if you are at risk for osteoporosis and fractures, ask your health care provider if you should be screened.  Ask your health care provider whether you should take a calcium or vitamin D supplement to lower your risk for osteoporosis.  Menopause may have certain physical symptoms and risks.  Hormone replacement therapy may reduce some of these symptoms and risks. Talk to your health care provider about whether hormone replacement therapy is right for you.  HOME CARE INSTRUCTIONS   Schedule regular health, dental, and eye exams.  Stay current with your immunizations.   Do not use any tobacco products including cigarettes, chewing tobacco, or electronic cigarettes.  If you are pregnant, do not drink alcohol.  If you are breastfeeding, limit how much and how often you drink alcohol.  Limit alcohol intake to no more than 1 drink per day for nonpregnant women. One drink equals 12 ounces of beer, 5 ounces of wine, or 1 ounces of hard liquor.  Do not use street drugs.  Do not share needles.  Ask your health care provider for help if you need support or information about quitting drugs.  Tell your health care provider if you often feel depressed.  Tell your health care provider if you have ever been abused or do not feel safe at home. Document Released: 12/05/2010 Document Revised: 10/06/2013 Document Reviewed: 04/23/2013 Exit[employer] Patient Information 2015 [employer]. This information is not intended to replace advice given to you by your health care provider. Make sure you discuss any questions you have with your health care provider.  

## 2015-01-23 LAB — URINALYSIS W MICROSCOPIC + REFLEX CULTURE
BILIRUBIN URINE: NEGATIVE
CRYSTALS: NONE SEEN [HPF]
Casts: NONE SEEN [LPF]
GLUCOSE, UA: NEGATIVE
Hgb urine dipstick: NEGATIVE
KETONES UR: NEGATIVE
Nitrite: NEGATIVE
Protein, ur: NEGATIVE
RBC / HPF: NONE SEEN RBC/HPF (ref <=2)
Specific Gravity, Urine: 1.027 (ref 1.001–1.035)
Yeast: NONE SEEN [HPF]
pH: 6 (ref 5.0–8.0)

## 2015-01-23 LAB — HCG, SERUM, QUALITATIVE: Preg, Serum: NEGATIVE

## 2015-01-23 LAB — GC/CHLAMYDIA PROBE AMP
CT Probe RNA: NEGATIVE
GC Probe RNA: NEGATIVE

## 2015-01-23 LAB — FOLLICLE STIMULATING HORMONE: FSH: 8 m[IU]/mL

## 2015-01-23 LAB — PROLACTIN: Prolactin: 10 ng/mL

## 2015-01-24 LAB — URINE CULTURE
Colony Count: NO GROWTH
Organism ID, Bacteria: NO GROWTH

## 2015-02-04 ENCOUNTER — Telehealth: Payer: Self-pay | Admitting: *Deleted

## 2015-02-04 MED ORDER — FLUCONAZOLE 150 MG PO TABS: 150.0000 mg | ORAL_TABLET | Freq: Once | ORAL | Status: DC

## 2015-02-04 NOTE — Telephone Encounter (Signed)
Pt was treated with flagyl on OV 01/22/15 c/o itching and white discharge requesting diflucan tablet. Please advise

## 2015-02-04 NOTE — Telephone Encounter (Signed)
Pt aware Rx sent.  

## 2015-02-04 NOTE — Telephone Encounter (Signed)
Okay for Diflucan 150 mg 1 dose 

## 2015-03-30 ENCOUNTER — Ambulatory Visit (INDEPENDENT_AMBULATORY_CARE_PROVIDER_SITE_OTHER): Payer: BC Managed Care – PPO | Admitting: Emergency Medicine

## 2015-03-30 VITALS — BP 118/78 | HR 77 | Temp 98.3°F | Resp 16 | Ht 65.0 in | Wt 126.0 lb

## 2015-03-30 DIAGNOSIS — J302 Other seasonal allergic rhinitis: Secondary | ICD-10-CM | POA: Diagnosis not present

## 2015-03-30 DIAGNOSIS — J4531 Mild persistent asthma with (acute) exacerbation: Secondary | ICD-10-CM

## 2015-03-30 MED ORDER — ALBUTEROL SULFATE HFA 108 (90 BASE) MCG/ACT IN AERS: 2.0000 | INHALATION_SPRAY | RESPIRATORY_TRACT | Status: DC | PRN

## 2015-03-30 MED ORDER — IPRATROPIUM BROMIDE 0.02 % IN SOLN
0.5000 mg | Freq: Once | RESPIRATORY_TRACT | Status: AC
Start: 2015-03-30 — End: 2015-03-30
  Administered 2015-03-30: 0.5 mg via RESPIRATORY_TRACT

## 2015-03-30 MED ORDER — MOMETASONE FUROATE 50 MCG/ACT NA SUSP: 2.0000 | Freq: Every day | NASAL | Status: DC

## 2015-03-30 MED ORDER — ALBUTEROL SULFATE (2.5 MG/3ML) 0.083% IN NEBU
5.0000 mg | INHALATION_SOLUTION | Freq: Once | RESPIRATORY_TRACT | Status: AC
  Administered 2015-03-30: 5 mg via RESPIRATORY_TRACT

## 2015-03-30 NOTE — Progress Notes (Signed)
Subjective:  Patient ID: Angelica Dillon, female    DOB: [birth year]  Age: 34 y.o. MRN: [id number]  CC: Cough; Sinusitis; and Shortness of Breath   HPI Angelica Dillon presents   With a worsening of her allergies. She has watery clear nasal drainage and postnasal drip. She has no sore throat. She's no fever chills. She has wheezing and shortness of breath with exertion. She has a nonproductive cough. She has no nausea vomiting or stool change. She has no rash. Said no improvement with over-the-counter medication particularly with cough.  History Angelica Dillon has a past medical history of Cervical dysplasia.   She has past surgical history that includes Breast surgery and Colposcopy.   Her  family history includes Breast cancer (age of onset: 77) in her maternal aunt; Colon cancer in her maternal grandmother; Diabetes in her paternal grandfather; Hypertension in her maternal aunt, maternal uncle, and mother.  She   reports that she has been smoking Cigarettes.  She has been smoking about 1.00 pack per day. She has never used smokeless tobacco. She reports that she drinks about 2.4 oz of alcohol per week. She reports that she does not use illicit drugs.  Outpatient Prescriptions Prior to Visit  Medication Sig Dispense Refill  . fluconazole (DIFLUCAN) 150 MG tablet Take 1 tablet (150 mg total) by mouth once. 1 tablet 0  . medroxyPROGESTERone (PROVERA) 10 MG tablet Take 1 tablet (10 mg total) by mouth daily. (Patient not taking: Reported on 01/22/2015) 10 tablet 0  . metroNIDAZOLE (FLAGYL) 500 MG tablet Take 1 tablet (500 mg total) by mouth 2 (two) times daily. 14 tablet 0   No facility-administered medications prior to visit.    Social History   Social History  . Marital Status: Single    Spouse Name: N/A  . Number of Children: N/A  . Years of Education: N/A   Social History Main Topics  . Smoking status: Current Every Day Smoker -- 1.00 packs/day    Types: Cigarettes  . Smokeless  tobacco: Never Used  . Alcohol Use: 2.4 oz/week    4 Standard drinks or equivalent per week  . Drug Use: No  . Sexual Activity: Not Currently    Birth Control/ Protection:      Comment: 1st intercourse  34 yo-More than 5 partners   Other Topics Concern  . None   Social History Narrative     Review of Systems  Constitutional: Negative for fever, chills and appetite change.  HENT: Positive for congestion, postnasal drip and rhinorrhea. Negative for ear pain, sinus pressure and sore throat.   Eyes: Negative for pain and redness.  Respiratory: Positive for cough, shortness of breath and wheezing.   Cardiovascular: Negative for leg swelling.  Gastrointestinal: Negative for nausea, vomiting, abdominal pain, diarrhea, constipation and blood in stool.  Endocrine: Negative for polyuria.  Genitourinary: Negative for dysuria, urgency, frequency and flank pain.  Musculoskeletal: Negative for gait problem.  Skin: Negative for rash.  Neurological: Negative for weakness and headaches.  Psychiatric/Behavioral: Negative for confusion and decreased concentration. The patient is not nervous/anxious.     Objective:  BP 118/78 mmHg  Pulse 77  Temp(Src) 98.3 F (36.8 C) (Oral)  Resp 16  Ht 5\' 5"  (1.651 m)  Wt 126 lb (57.153 kg)  BMI 20.97 kg/m2  SpO2 99%  LMP 03/28/2015  Physical Exam  Constitutional: She is oriented to person, place, and time. She appears well-developed and well-nourished. No distress.  HENT:  Head: Normocephalic and  atraumatic.  Right Ear: External ear normal.  Left Ear: External ear normal.  Nose: Nose normal.  Eyes: Conjunctivae and EOM are normal. Pupils are equal, round, and reactive to light. No scleral icterus.  Neck: Normal range of motion. Neck supple. No tracheal deviation present.  Cardiovascular: Normal rate, regular rhythm and normal heart sounds.   Pulmonary/Chest: Effort normal. No respiratory distress. She has wheezes. She has no rales.  Abdominal: She  exhibits no mass. There is no tenderness. There is no rebound and no guarding.  Musculoskeletal: She exhibits no edema.  Lymphadenopathy:    She has no cervical adenopathy.  Neurological: She is alert and oriented to person, place, and time. Coordination normal.  Skin: Skin is warm and dry. No rash noted.  Psychiatric: She has a normal mood and affect. Her behavior is normal.      Assessment & Plan:   Angelica Dillon was seen today for cough, sinusitis and shortness of breath.  Diagnoses and all orders for this visit:  Seasonal allergic rhinitis -     ipratropium (ATROVENT) nebulizer solution 0.5 mg; Take 2.5 mLs (0.5 mg total) by nebulization once. -     albuterol (PROVENTIL) (2.5 MG/3ML) 0.083% nebulizer solution 5 mg; Take 6 mLs (5 mg total) by nebulization once.  Reactive airway disease, mild persistent, with acute exacerbation -     ipratropium (ATROVENT) nebulizer solution 0.5 mg; Take 2.5 mLs (0.5 mg total) by nebulization once. -     albuterol (PROVENTIL) (2.5 MG/3ML) 0.083% nebulizer solution 5 mg; Take 6 mLs (5 mg total) by nebulization once.  Other orders -     mometasone (NASONEX) 50 MCG/ACT nasal spray; Place 2 sprays into the nose daily. -     albuterol (PROVENTIL HFA;VENTOLIN HFA) 108 (90 BASE) MCG/ACT inhaler; Inhale 2 puffs into the lungs every 4 (four) hours as needed for wheezing or shortness of breath (cough, shortness of breath or wheezing.).  I have discontinued [name]'s medroxyPROGESTERone, metroNIDAZOLE, fluconazole, and cromolyn. I am also having her start on mometasone and albuterol. We administered ipratropium and albuterol.  Meds ordered this encounter  Medications  . DISCONTD: cromolyn (NASALCROM) 5.2 MG/ACT nasal spray    Sig: Place 1 spray into both nostrils 4 (four) times daily.  Marland Kitchen ipratropium (ATROVENT) nebulizer solution 0.5 mg    Sig:   . albuterol (PROVENTIL) (2.5 MG/3ML) 0.083% nebulizer solution 5 mg    Sig:   . mometasone (NASONEX) 50 MCG/ACT  nasal spray    Sig: Place 2 sprays into the nose daily.    Dispense:  17 g    Refill:  12  . albuterol (PROVENTIL HFA;VENTOLIN HFA) 108 (90 BASE) MCG/ACT inhaler    Sig: Inhale 2 puffs into the lungs every 4 (four) hours as needed for wheezing or shortness of breath (cough, shortness of breath or wheezing.).    Dispense:  1 Inhaler    Refill:  1    she had marked improvement in her wheezing and cough and air movement with nebulized aerosol treatment. Discussed with her how to properly    Employ   An MDI and nasal spray  Appropriate red flag conditions were discussed with the patient as well as actions that should be taken.  Patient expressed his understanding.  Follow-up: Return if symptoms worsen or fail to improve.  Roselee Culver, MD

## 2015-03-30 NOTE — Patient Instructions (Signed)
Metered Dose Inhaler (No Spacer Used) Inhaled medicines are the basis of treatment for asthma and other breathing problems. Inhaled medicine can only be effective if used properly. Good technique assures that the medicine reaches the lungs. Metered dose inhalers (MDIs) are used to deliver a variety of inhaled medicines. These include quick relief or rescue medicines (such as bronchodilators) and controller medicines (such as corticosteroids). The medicine is delivered by pushing down on a metal canister to release a set amount of spray. If you are using different kinds of inhalers, use your quick relief medicine to open the airways 10-15 minutes before using a steroid, if instructed to do so by your health care provider. If you are unsure which inhalers to use and the order of using them, ask your health care provider, nurse, or respiratory therapist. HOW TO USE THE INHALER 1. Remove the cap from the inhaler. 2. If you are using the inhaler for the first time, you will need to prime it. Shake the inhaler for 5 seconds and release four puffs into the air, away from your face. Ask your health care provider or pharmacist if you have questions about priming your inhaler. 3. Shake the inhaler for 5 seconds before each breath in (inhalation). 4. Position the inhaler so that the top of the canister faces up. 5. Put your index finger on the top of the medicine canister. Your thumb supports the bottom of the inhaler. 6. Open your mouth. 7. Either place the inhaler between your teeth and place your lips tightly around the mouthpiece, or hold the inhaler 1-2 inches away from your open mouth. If you are unsure of which technique to use, ask your health care provider. 8. Breathe out (exhale) normally and as completely as possible. 9. Press the canister down with the index finger to release the medicine. 10. At the same time as the canister is pressed, inhale deeply and slowly until your lungs are completely filled.  This should take 4-6 seconds. Keep your tongue down. 11. Hold the medicine in your lungs for 5-10 seconds (10 seconds is best). This helps the medicine get into the small airways of your lungs. 12. Breathe out slowly, through pursed lips. Whistling is an example of pursed lips. 13. Wait at least 1 minute between puffs. Continue with the above steps until you have taken the number of puffs your health care provider has ordered. Do not use the inhaler more than your health care provider directs you to. 14. Replace the cap on the inhaler. 15. Follow the directions from your health care provider or the inhaler insert for cleaning the inhaler. If you are using a steroid inhaler, after your last puff, rinse your mouth with water, gargle, and spit out the water. Do not swallow the water. AVOID:  Inhaling before or after starting the spray of medicine. It takes practice to coordinate your breathing with triggering the spray.  Inhaling through the nose (rather than the mouth) when triggering the spray. HOW TO DETERMINE IF YOUR INHALER IS FULL OR NEARLY EMPTY You cannot know when an inhaler is empty by shaking it. Some inhalers are now being made with dose counters. Ask your health care provider for a prescription that has a dose counter if you feel you need that extra help. If your inhaler does not have a counter, ask your health care provider to help you determine the date you need to refill your inhaler. Write the refill date on a calendar or your inhaler canister. Refill   your inhaler 7-10 days before it runs out. Be sure to keep an adequate supply of medicine. This includes making sure it has not expired, and making sure you have a spare inhaler. SEEK MEDICAL CARE IF:  Symptoms are only partially relieved with your inhaler.  You are having trouble using your inhaler.  You experience an increase in phlegm. SEEK IMMEDIATE MEDICAL CARE IF:  You feel little or no relief with your inhalers. You are still  wheezing and feeling shortness of breath, tightness in your chest, or both.  You have dizziness, headaches, or a fast heart rate.  You have chills, fever, or night sweats.  There is a noticeable increase in phlegm production, or there is blood in the phlegm. MAKE SURE YOU:  Understand these instructions.  Will watch your condition.  Will get help right away if you are not doing well or get worse.   This information is not intended to replace advice given to you by your health care provider. Make sure you discuss any questions you have with your health care provider.   Document Released: 03/19/2007 Document Revised: 06/12/2014 Document Reviewed: 11/07/2012 Elsevier Interactive Patient Education 2016 [employer]  

## 2015-07-20 ENCOUNTER — Telehealth: Payer: Self-pay | Admitting: *Deleted

## 2015-07-20 NOTE — Telephone Encounter (Signed)
Pt called requesting Rx for Chantix , started back smoking again, pt asked if you could prescribed without OV? Please advise

## 2015-07-21 MED ORDER — VARENICLINE TARTRATE 1 MG PO TABS: ORAL_TABLET | ORAL | Status: DC

## 2015-07-21 MED ORDER — VARENICLINE TARTRATE 0.5 MG X 11 & 1 MG X 42 PO MISC: ORAL | Status: DC

## 2015-07-21 NOTE — Telephone Encounter (Signed)
Okay for Chantix. Prescribe a starter pack and a continuation prescription for a total of 3 months. Remind patient more common side effects including GI as well as bad dreams. If she would start to feel more depressed or anxious after starting the medication that she needs to call us right away.

## 2015-07-21 NOTE — Telephone Encounter (Signed)
Pt aware of the below, the starter pack has standard directions  that come when placed in epic, however the continuation pack directions only say twice daily, I just to confirm patient is going to be taking continuation pack 1 pill twice daily x 3 months? Please advise

## 2015-07-21 NOTE — Telephone Encounter (Signed)
Days 1 through 3, 0.5 mg daily Days 4 through 7, 0.5 mg twice a day Starting day 8 through remainder of three-month course 1 mg twice a day

## 2015-07-21 NOTE — Telephone Encounter (Signed)
Spoke with pharmacist Eslem at [facility] [address] and was told that both Rx have standard directions since both Rx could not be e-scribed to put in directions take as directed and this was done, as the pharmacist wanted me to send Rx electronically.

## 2016-02-09 ENCOUNTER — Encounter: Payer: Self-pay | Admitting: Gynecology

## 2016-02-09 ENCOUNTER — Ambulatory Visit (INDEPENDENT_AMBULATORY_CARE_PROVIDER_SITE_OTHER): Payer: BC Managed Care – PPO | Admitting: Gynecology

## 2016-02-09 VITALS — BP 116/76 | Ht 64.0 in | Wt 123.0 lb

## 2016-02-09 DIAGNOSIS — N898 Other specified noninflammatory disorders of vagina: Secondary | ICD-10-CM

## 2016-02-09 DIAGNOSIS — Z720 Tobacco use: Secondary | ICD-10-CM | POA: Diagnosis not present

## 2016-02-09 DIAGNOSIS — Z716 Tobacco abuse counseling: Secondary | ICD-10-CM

## 2016-02-09 DIAGNOSIS — Z01419 Encounter for gynecological examination (general) (routine) without abnormal findings: Secondary | ICD-10-CM

## 2016-02-09 DIAGNOSIS — Z13220 Encounter for screening for lipoid disorders: Secondary | ICD-10-CM

## 2016-02-09 LAB — COMPREHENSIVE METABOLIC PANEL
ALK PHOS: 50 U/L (ref 33–115)
ALT: 12 U/L (ref 6–29)
AST: 17 U/L (ref 10–30)
Albumin: 4.4 g/dL (ref 3.6–5.1)
BUN: 8 mg/dL (ref 7–25)
CALCIUM: 9.4 mg/dL (ref 8.6–10.2)
CHLORIDE: 105 mmol/L (ref 98–110)
CO2: 24 mmol/L (ref 20–31)
Creat: 0.75 mg/dL (ref 0.50–1.10)
Glucose, Bld: 88 mg/dL (ref 65–99)
POTASSIUM: 4.5 mmol/L (ref 3.5–5.3)
Sodium: 135 mmol/L (ref 135–146)
TOTAL PROTEIN: 7.1 g/dL (ref 6.1–8.1)
Total Bilirubin: 0.3 mg/dL (ref 0.2–1.2)

## 2016-02-09 LAB — WET PREP FOR TRICH, YEAST, CLUE
TRICH WET PREP: NONE SEEN
YEAST WET PREP: NONE SEEN

## 2016-02-09 LAB — CBC WITH DIFFERENTIAL/PLATELET
BASOS ABS: 51 {cells}/uL (ref 0–200)
BASOS PCT: 1 %
EOS ABS: 102 {cells}/uL (ref 15–500)
Eosinophils Relative: 2 %
HEMATOCRIT: 37.5 % (ref 35.0–45.0)
Hemoglobin: 11.9 g/dL (ref 11.7–15.5)
LYMPHS PCT: 46 %
Lymphs Abs: 2346 cells/uL (ref 850–3900)
MCH: 22.9 pg — AB (ref 27.0–33.0)
MCHC: 31.7 g/dL — ABNORMAL LOW (ref 32.0–36.0)
MCV: 72.3 fL — AB (ref 80.0–100.0)
MONO ABS: 561 {cells}/uL (ref 200–950)
MONOS PCT: 11 %
MPV: 10.1 fL (ref 7.5–12.5)
NEUTROS ABS: 2040 {cells}/uL (ref 1500–7800)
Neutrophils Relative %: 40 %
Platelets: 255 10*3/uL (ref 140–400)
RBC: 5.19 MIL/uL — ABNORMAL HIGH (ref 3.80–5.10)
RDW: 15.5 % — ABNORMAL HIGH (ref 11.0–15.0)
WBC: 5.1 10*3/uL (ref 3.8–10.8)

## 2016-02-09 MED ORDER — VARENICLINE TARTRATE 0.5 MG X 11 & 1 MG X 42 PO MISC: ORAL | 0 refills | Status: DC

## 2016-02-09 MED ORDER — METRONIDAZOLE 500 MG PO TABS: 500.0000 mg | ORAL_TABLET | Freq: Two times a day (BID) | ORAL | 0 refills | Status: DC

## 2016-02-09 MED ORDER — VARENICLINE TARTRATE 1 MG PO TABS: 1.0000 mg | ORAL_TABLET | Freq: Two times a day (BID) | ORAL | 1 refills | Status: DC

## 2016-02-09 NOTE — Progress Notes (Signed)
    Angelica Dillon December 04, 1980 [id number]        35 y.o.  G1P0010  for several issues discussed below.  Past medical history,surgical history, problem list, medications, allergies, family history and social history were all reviewed and documented as reviewed in the EPIC chart.  ROS:  Performed with pertinent positives and negatives included in the history, assessment and plan.   Additional significant findings :  None   Exam: Caryn Bee assistant Vitals:   02/09/16 0938  BP: 116/76  Weight: 123 lb (55.8 kg)  Height: 5\' 4"  (1.626 m)   Body mass index is 21.11 kg/m.  General appearance:  Normal affect, orientation and appearance. Skin: Grossly normal HEENT: Without gross lesions.  No cervical or supraclavicular adenopathy. Thyroid normal.  Lungs:  Clear without wheezing, rales or rhonchi Cardiac: RR, without RMG Abdominal:  Soft, nontender, without masses, guarding, rebound, organomegaly or hernia Breasts:  Examined lying and sitting without masses, retractions, discharge or axillary adenopathy. Pelvic:  Ext/BUS/Vagina with heavy white discharge  Cervix normal  Uterus anteverted, normal size, shape and contour, midline and mobile nontender   Adnexa without masses or tenderness    Anus and perineum normal   Rectovaginal normal sphincter tone without palpated masses or tenderness.    Assessment/Plan:  35 y.o. G81P0010 female for annual exam with mild irregular menses, no contraception.   1. Irregular menses. Patient's menses come generally monthly but will vary a little earlier in the month or later in the month. Normal length with no intermenstrual bleeding. Hormonal evaluation last year showed a normal FSH TSH prolactin. We'll continue to monitor for now. Report any significant irregularity. 2. Vaginal discharge. Patient does note her discharges little heavier over the past week or so. No itching irritation or odor.  Wet prep is positive for bacterial vaginosis. She does have a  history of this before. Will treat with Flagyl 500 mg twice a day 7 days, alcohol avoidance reviewed. 3. No contraception. Patient would accept pregnancy occurs but is not actively pursuing this. Did recommend starting a multivitamin with folic acid now preconceptionally patient agrees. 4. Stop smoking. I again discussed smoking and the need to stop smoking and various strategies. Has tried Chantix in the past unsuccessfully.  Requests another trial of Chantix. She relates that she would finish the first month feel good not smoke and then not continue for the next 2 months and would relapse. I stressed the need to continue for the 12th weeks. Refill for Chantix provided. 5. Pap smear/HPV 01/2014 negative. No Pap smear done today. History of dysplasia as a teenager with normal Pap smears since. Plan repeat Pap smear approaching 5 year interval per current screening guidelines. 6. Breast health. SBE monthly reviewed. 7. Health maintenance. Baseline CBC, CMP and urinalysis ordered. Lipid profile last year was great and not repeated. Follow up in one year, sooner as needed.  15 minutes of my time in excess of her routine GYN exam was spent in direct face to face counseling and coordination of care in regards to her problems of stop smoking counseling and vaginal discharge.    Anastasio Auerbach MD, 9:55 AM 02/09/2016

## 2016-02-09 NOTE — Patient Instructions (Signed)
Take the Flagyl antibiotic twice daily for 7 days for the vaginal discharge. Avoid alcohol while taking.  Start back on Chantix as we discussed.  Varenicline oral tablets What is this medicine? VARENICLINE (var EN i kleen) is used to help people quit smoking. It can reduce the symptoms caused by stopping smoking. It is used with a patient support program recommended by your physician. This medicine may be used for other purposes; ask your health care provider or pharmacist if you have questions. What should I tell my health care provider before I take this medicine? They need to know if you have any of these conditions: -bipolar disorder, depression, schizophrenia or other mental illness -heart disease -if you often drink alcohol -kidney disease -peripheral vascular disease -seizures -stroke -suicidal thoughts, plans, or attempt; a previous suicide attempt by you or a family member -an unusual or allergic reaction to varenicline, other medicines, foods, dyes, or preservatives -pregnant or trying to get pregnant -breast-feeding How should I use this medicine? Take this medicine by mouth after eating. Take with a full glass of water. Follow the directions on the prescription label. Take your doses at regular intervals. Do not take your medicine more often than directed. There are 3 ways you can use this medicine to help you quit smoking; talk to your health care professional to decide which plan is right for you: 1) you can choose a quit date and start this medicine 1 week before the quit date, or, 2) you can start taking this medicine before you choose a quit date, and then pick a quit date between day 8 and 35 days of treatment, or, 3) if you are not sure that you are able or willing to quit smoking right away, start taking this medicine and slowly decrease the amount you smoke as directed by your health care professional with the goal of being cigarette-free by week 12 of treatment. Stick  to your plan; ask about support groups or other ways to help you remain cigarette-free. If you are motivated to quit smoking and did not succeed during a previous attempt with this medicine for reasons other than side effects, or if you returned to smoking after this treatment, speak with your health care professional about whether another course of this medicine may be right for you. A special MedGuide will be given to you by the pharmacist with each prescription and refill. Be sure to read this information carefully each time. Talk to your pediatrician regarding the use of this medicine in children. This medicine is not approved for use in children. Overdosage: If you think you have taken too much of this medicine contact a poison control center or emergency room at once. NOTE: This medicine is only for you. Do not share this medicine with others. What if I miss a dose? If you miss a dose, take it as soon as you can. If it is almost time for your next dose, take only that dose. Do not take double or extra doses. What may interact with this medicine? -alcohol or any product that contains alcohol -insulin -other stop smoking aids -theophylline -warfarin This list may not describe all possible interactions. Give your health care provider a list of all the medicines, herbs, non-prescription drugs, or dietary supplements you use. Also tell them if you smoke, drink alcohol, or use illegal drugs. Some items may interact with your medicine. What should I watch for while using this medicine? Visit your doctor or health care professional for regular  check ups. Ask for ongoing advice and encouragement from your doctor or healthcare professional, friends, and family to help you quit. If you smoke while on this medication, quit again Your mouth may get dry. Chewing sugarless gum or sucking hard candy, and drinking plenty of water may help. Contact your doctor if the problem does not go away or is severe. You  may get drowsy or dizzy. Do not drive, use machinery, or do anything that needs mental alertness until you know how this medicine affects you. Do not stand or sit up quickly, especially if you are an older patient. This reduces the risk of dizzy or fainting spells. Sleepwalking can happen during treatment with this medicine, and can sometimes lead to behavior that is harmful to you, other people, or property. Stop taking this medicine and tell your doctor if you start sleepwalking or have other unusual sleep-related activity. Decrease the amount of alcoholic beverages that you drink during treatment with this medicine until you know if this medicine affects your ability to tolerate alcohol. Some people have experienced increased drunkenness (intoxication), unusual or sometimes aggressive behavior, or no memory of things that have happened (amnesia) during treatment with this medicine. The use of this medicine may increase the chance of suicidal thoughts or actions. Pay special attention to how you are responding while on this medicine. Any worsening of mood, or thoughts of suicide or dying should be reported to your health care professional right away. What side effects may I notice from receiving this medicine? Side effects that you should report to your doctor or health care professional as soon as possible: -allergic reactions like skin rash, itching or hives, swelling of the face, lips, tongue, or throat -acting aggressive, being angry or violent, or acting on dangerous impulses -breathing problems -changes in vision -chest pain or chest tightness -confusion, trouble speaking or understanding -new or worsening depression, anxiety, or panic attacks -extreme increase in activity and talking (mania) -fast, irregular heartbeat -feeling faint or lightheaded, falls -fever -pain in legs when walking -problems with balance, talking, walking -redness, blistering, peeling or loosening of the skin,  including inside the mouth -ringing in ears -seeing or hearing things that aren't there (hallucinations) -seizures -sleepwalking -sudden numbness or weakness of the face, arm or leg -thoughts about suicide or dying, or attempts to commit suicide -trouble passing urine or change in the amount of urine -unusual bleeding or bruising -unusually weak or tired Side effects that usually do not require medical attention (report to your doctor or health care professional if they continue or are bothersome): -constipation -headache -nausea, vomiting -strange dreams -stomach gas -trouble sleeping This list may not describe all possible side effects. Call your doctor for medical advice about side effects. You may report side effects to FDA at 1-800-FDA-1088. Where should I keep my medicine? Keep out of the reach of children. Store at room temperature between 15 and 30 degrees C (59 and 86 degrees F). Throw away any unused medicine after the expiration date. NOTE: This sheet is a summary. It may not cover all possible information. If you have questions about this medicine, talk to your doctor, pharmacist, or health care provider.    2016, Elsevier/Gold Standard. (2015-02-04 16:14:23)

## 2016-02-10 LAB — URINALYSIS W MICROSCOPIC + REFLEX CULTURE
BACTERIA UA: NONE SEEN [HPF]
BILIRUBIN URINE: NEGATIVE
CASTS: NONE SEEN [LPF]
CRYSTALS: NONE SEEN [HPF]
Glucose, UA: NEGATIVE
HGB URINE DIPSTICK: NEGATIVE
KETONES UR: NEGATIVE
Leukocytes, UA: NEGATIVE
NITRITE: NEGATIVE
Protein, ur: NEGATIVE
Specific Gravity, Urine: 1.009 (ref 1.001–1.035)
Yeast: NONE SEEN [HPF]
pH: 5 (ref 5.0–8.0)

## 2016-02-11 LAB — URINE CULTURE: ORGANISM ID, BACTERIA: NO GROWTH

## 2016-09-02 ENCOUNTER — Ambulatory Visit (INDEPENDENT_AMBULATORY_CARE_PROVIDER_SITE_OTHER): Payer: BC Managed Care – PPO

## 2016-09-02 ENCOUNTER — Ambulatory Visit (INDEPENDENT_AMBULATORY_CARE_PROVIDER_SITE_OTHER): Payer: BC Managed Care – PPO | Admitting: Urgent Care

## 2016-09-02 VITALS — BP 122/74 | HR 79 | Temp 97.9°F | Resp 18 | Ht 64.0 in | Wt 118.5 lb

## 2016-09-02 DIAGNOSIS — M25562 Pain in left knee: Secondary | ICD-10-CM

## 2016-09-02 DIAGNOSIS — M25462 Effusion, left knee: Secondary | ICD-10-CM | POA: Diagnosis not present

## 2016-09-02 DIAGNOSIS — R938 Abnormal findings on diagnostic imaging of other specified body structures: Secondary | ICD-10-CM | POA: Diagnosis not present

## 2016-09-02 DIAGNOSIS — R9389 Abnormal findings on diagnostic imaging of other specified body structures: Secondary | ICD-10-CM

## 2016-09-02 MED ORDER — NAPROXEN SODIUM 550 MG PO TABS: 550.0000 mg | ORAL_TABLET | Freq: Two times a day (BID) | ORAL | 1 refills | Status: DC

## 2016-09-02 NOTE — Progress Notes (Signed)
  MRN: [id number] DOB: [birth year]  Subjective:   Angelica Dillon is a 36 y.o. female presenting for chief complaint of Knee Pain (C/O left knee pain since Thursday)  Reports 2 day history of left knee pain, swelling, warmth. Patient mowed the yard on Thursday and has since had her symptoms. Admits that she did bump her knee twice in the past month and a half. Has not tried any medications for relief. She denies recent trauma, fever, redness, popping, knee buckling, weakness. Denies personal history of gout or arthritis.  Shayda has a current medication list which includes the following prescription(s): albuterol. Also is allergic to bupropion.  Willette  has a past medical history of Cervical dysplasia. Also  has a past surgical history that includes Breast surgery and Colposcopy.  Objective:   Vitals: BP 122/74   Pulse 79   Temp 97.9 F (36.6 C) (Oral)   Resp 18   Ht 5\' 4"  (1.626 m)   Wt 118 lb 8 oz (53.8 kg)   LMP 08/26/2016 (Approximate)   SpO2 99%   BMI 20.34 kg/m   Physical Exam  Constitutional: She is oriented to person, place, and time. She appears well-developed and well-nourished.  Cardiovascular: Normal rate.   Pulmonary/Chest: Effort normal.  Musculoskeletal:       Left knee: She exhibits decreased range of motion (full flexion and extension) and swelling (with associated mild warmth). She exhibits no effusion, no ecchymosis, no deformity, no laceration, no erythema, normal patellar mobility, no bony tenderness and normal meniscus. Tenderness (with ROM testing specifically flexion and extension) found. No medial joint line, no lateral joint line, no MCL, no LCL and no patellar tendon tenderness noted.  Neurological: She is alert and oriented to person, place, and time. She displays normal reflexes.   Dg Knee Complete 4 Views Left  Result Date: 09/02/2016 CLINICAL DATA:  Left knee pain for the past 3 days. No known injury. EXAM: LEFT KNEE - COMPLETE 4+ VIEW COMPARISON:  None.  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 Otherwise, normal appearing bones and soft tissues. No effusion seen. IMPRESSION: No acute abnormality. 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 Electronically Signed   By: Claudie Revering M.D.   On: 09/02/2016 14:35   Assessment and Plan :   1. Acute pain of left knee 2. Pain and swelling of knee, left - Will use conservative management with RICE method, NSAID. RTC in 1 week if no improvement.  3. Abnormal x-ray examination - Benign finding, reviewed with patient.  Jaynee Eagles, PA-C [facility] [phone] 09/02/2016  1:57 PM

## 2016-09-02 NOTE — Patient Instructions (Addendum)
Knee Pain, Adult Knee pain in adults is common. It can be caused by many things, including:  Arthritis.  A fluid-filled sac (cyst) or growth in your knee.  An infection in your knee.  An injury that will not heal.  Damage, swelling, or irritation of the tissues that support your knee. Knee pain is usually not a sign of a serious problem. The pain may go away on its own with time and rest. If it does not, a health care provider may order tests to find the cause of the pain. These may include:  Imaging tests, such as an X-ray, MRI, or ultrasound.  Joint aspiration. In this test, fluid is removed from the knee.  Arthroscopy. In this test, a lighted tube is inserted into knee and an image is projected onto a TV screen.  A biopsy. In this test, a sample of tissue is removed from the body and studied under a microscope. Follow these instructions at home: Pay attention to any changes in your symptoms. Take these actions to relieve your pain. Activity   Rest your knee.  Do not do things that cause pain or make pain worse.  Avoid high-impact activities or exercises, such as running, jumping rope, or doing jumping jacks. General instructions   Take over-the-counter and prescription medicines only as told by your health care provider.  Raise (elevate) your knee above the level of your heart when you are sitting or lying down.  Sleep with a pillow under your knee.  If directed, apply ice to the knee:  Put ice in a plastic bag.  Place a towel between your skin and the bag.  Leave the ice on for 20 minutes, 2-3 times a day.  Ask your health care provider if you should wear an elastic knee support.  Lose weight if you are overweight. Extra weight can put pressure on your knee.  Do not use any products that contain nicotine or tobacco, such as cigarettes and e-cigarettes. Smoking may slow the healing of any bone and joint problems that you may have. If you need help quitting, ask  your health care provider. Contact a health care provider if:  Your knee pain continues, changes, or gets worse.  You have a fever along with knee pain.  Your knee buckles or locks up.  Your knee swells, and the swelling becomes worse. Get help right away if:  Your knee feels warm to the touch.  You cannot move your knee.  You have severe pain in your knee.  You have chest pain.  You have trouble breathing. Summary  Knee pain in adults is common. It can be caused by many things, including, arthritis, infection, cysts, or injury.  Knee pain is usually not a sign of a serious problem, but if it does not go away, a health care provider may perform tests to know the cause of the pain.  Pay attention to any changes in your symptoms. Relieve your pain with rest, medicines, light activity, and use of ice.  Get help if your pain continues or becomes very severe, or if your knee buckles or locks up, or if you have chest pain or trouble breathing. This information is not intended to replace advice given to you by your health care provider. Make sure you discuss any questions you have with your health care provider. Document Released: 03/19/2007 Document Revised: 05/12/2016 Document Reviewed: 05/12/2016 Elsevier Interactive Patient Education  2017 [employer] for Routine Care of Injuries  Theroutine careofmanyinjuriesincludes rest, ice, compression, and elevation (RICE therapy). RICE therapy is often recommended for injuries to soft tissues, such as a muscle strain, ligament injuries, bruises, and overuse injuries. It can also be used for some bony injuries. Using RICE therapy can help to relieve pain, lessen swelling, and enable your body to heal. Rest Rest is required to allow your body to heal. This usually involves reducing your normal activities and avoiding use of the injured part of your body. Generally, you can return to your normal activities when you are  comfortable and have been given permission by your health care provider. Ice   Icing your injury helps to keep the swelling down, and it lessens pain. Do not apply ice directly to your skin.  Put ice in a plastic bag.  Place a towel between your skin and the bag.  Leave the ice on for 20 minutes, 2-3 times a day. Do this for as long as you are directed by your health care provider. Compression Compression means putting pressure on the injured area. Compression helps to keep swelling down, gives support, and helps with discomfort. Compression may be done with an elastic bandage. If an elastic bandage has been applied, follow these general tips:  Remove and reapply the bandage every 3-4 hours or as directed by your health care provider.  Make sure the bandage is not wrapped too tightly, because this can cut off circulation. If part of your body beyond the bandage becomes blue, numb, cold, swollen, or more painful, your bandage is most likely too tight. If this occurs, remove your bandage and reapply it more loosely.  See your health care provider if the bandage seems to be making your problems worse rather than better. Elevation   Elevation means keeping the injured area raised. This helps to lessen swelling and decrease pain. If possible, your injured area should be elevated at or above the level of your heart or the center of your chest. When should I seek medical care?  If your pain and swelling continue.  If your symptoms are getting worse rather than improving. These symptoms may indicate that further evaluation or further X-rays are needed. Sometimes, X-rays may not show a small broken bone (fracture) until a number of days later. Make a follow-up appointment with your health care provider. When should I seek immediate medical care?  If you have sudden severe pain at or below the area of your injury.  If you have redness or increased swelling around your injury.  If you have  tingling or numbness at or below the area of your injury that does not improve after you remove the elastic bandage. This information is not intended to replace advice given to you by your health care provider. Make sure you discuss any questions you have with your health care provider. Document Released: 09/03/2000 Document Revised: 10/26/2015 Document Reviewed: 04/29/2014 Elsevier Interactive Patient Education  2017 [employer].   IF you received an x-ray today, you will receive an invoice from [facility]. Please contact [facility] at [phone] with questions or concerns regarding your invoice.   IF you received labwork today, you will receive an invoice from [place]. Please contact [facility] at [phone] with questions or concerns regarding your invoice.   Our billing staff will not be able to assist you with questions regarding bills from these companies.  You will be contacted with the lab results as soon as they are available. The fastest way to get your results is  to activate your My Chart account. Instructions are located on the last page of this paperwork. If you have not heard from us regarding the results in 2 weeks, please contact this office.      

## 2016-09-08 ENCOUNTER — Ambulatory Visit: Payer: BC Managed Care – PPO

## 2016-10-18 ENCOUNTER — Encounter: Payer: Self-pay | Admitting: Gynecology

## 2016-10-20 ENCOUNTER — Telehealth: Payer: Self-pay | Admitting: Urgent Care

## 2016-10-20 NOTE — Telephone Encounter (Signed)
Do you want a re eval or order more imaging?

## 2016-10-20 NOTE — Telephone Encounter (Signed)
Pt came in for swelling in left knee. Pt was seen and x-rays were done. Pt said she was told if swelling did not go down she may need to be referred out for more imaging. Please advise. Thanks!

## 2016-10-23 NOTE — Telephone Encounter (Signed)
No answer, please try to schedule

## 2016-10-23 NOTE — Telephone Encounter (Signed)
Patient needs a recheck. I last saw her at the end of March 2018. She can come in and be evaluated to see what she needs. Thank you!

## 2016-11-07 ENCOUNTER — Ambulatory Visit: Payer: BC Managed Care – PPO | Admitting: Urgent Care

## 2016-11-10 ENCOUNTER — Encounter: Payer: Self-pay | Admitting: Urgent Care

## 2016-11-10 ENCOUNTER — Ambulatory Visit (INDEPENDENT_AMBULATORY_CARE_PROVIDER_SITE_OTHER): Payer: BC Managed Care – PPO | Admitting: Urgent Care

## 2016-11-10 VITALS — BP 123/86 | HR 85 | Temp 98.0°F | Resp 18 | Ht 64.0 in | Wt 116.0 lb

## 2016-11-10 DIAGNOSIS — R938 Abnormal findings on diagnostic imaging of other specified body structures: Secondary | ICD-10-CM

## 2016-11-10 DIAGNOSIS — L609 Nail disorder, unspecified: Secondary | ICD-10-CM | POA: Diagnosis not present

## 2016-11-10 DIAGNOSIS — M25562 Pain in left knee: Secondary | ICD-10-CM

## 2016-11-10 DIAGNOSIS — M25462 Effusion, left knee: Secondary | ICD-10-CM

## 2016-11-10 DIAGNOSIS — G8929 Other chronic pain: Secondary | ICD-10-CM | POA: Diagnosis not present

## 2016-11-10 DIAGNOSIS — R9389 Abnormal findings on diagnostic imaging of other specified body structures: Secondary | ICD-10-CM

## 2016-11-10 DIAGNOSIS — Z9109 Other allergy status, other than to drugs and biological substances: Secondary | ICD-10-CM

## 2016-11-10 MED ORDER — NAPROXEN SODIUM 550 MG PO TABS: 550.0000 mg | ORAL_TABLET | Freq: Two times a day (BID) | ORAL | 1 refills | Status: DC

## 2016-11-10 NOTE — Patient Instructions (Addendum)
Knee Pain, Adult Knee pain in adults is common. It can be caused by many things, including:  Arthritis.  A fluid-filled sac (cyst) or growth in your knee.  An infection in your knee.  An injury that will not heal.  Damage, swelling, or irritation of the tissues that support your knee.  Knee pain is usually not a sign of a serious problem. The pain may go away on its own with time and rest. If it does not, a health care provider may order tests to find the cause of the pain. These may include:  Imaging tests, such as an X-ray, MRI, or ultrasound.  Joint aspiration. In this test, fluid is removed from the knee.  Arthroscopy. In this test, a lighted tube is inserted into knee and an image is projected onto a TV screen.  A biopsy. In this test, a sample of tissue is removed from the body and studied under a microscope.  Follow these instructions at home: Pay attention to any changes in your symptoms. Take these actions to relieve your pain. Activity  Rest your knee.  Do not do things that cause pain or make pain worse.  Avoid high-impact activities or exercises, such as running, jumping rope, or doing jumping jacks. General instructions  Take over-the-counter and prescription medicines only as told by your health care provider.  Raise (elevate) your knee above the level of your heart when you are sitting or lying down.  Sleep with a pillow under your knee.  If directed, apply ice to the knee: ? Put ice in a plastic bag. ? Place a towel between your skin and the bag. ? Leave the ice on for 20 minutes, 2-3 times a day.  Ask your health care provider if you should wear an elastic knee support.  Lose weight if you are overweight. Extra weight can put pressure on your knee.  Do not use any products that contain nicotine or tobacco, such as cigarettes and e-cigarettes. Smoking may slow the healing of any bone and joint problems that you may have. If you need help quitting, ask  your health care provider. Contact a health care provider if:  Your knee pain continues, changes, or gets worse.  You have a fever along with knee pain.  Your knee buckles or locks up.  Your knee swells, and the swelling becomes worse. Get help right away if:  Your knee feels warm to the touch.  You cannot move your knee.  You have severe pain in your knee.  You have chest pain.  You have trouble breathing. Summary  Knee pain in adults is common. It can be caused by many things, including, arthritis, infection, cysts, or injury.  Knee pain is usually not a sign of a serious problem, but if it does not go away, a health care provider may perform tests to know the cause of the pain.  Pay attention to any changes in your symptoms. Relieve your pain with rest, medicines, light activity, and use of ice.  Get help if your pain continues or becomes very severe, or if your knee buckles or locks up, or if you have chest pain or trouble breathing. This information is not intended to replace advice given to you by your health care provider. Make sure you discuss any questions you have with your health care provider. Document Released: 03/19/2007 Document Revised: 05/12/2016 Document Reviewed: 05/12/2016 Elsevier Interactive Patient Education  2018 [employer].     IF you received an x-ray  today, you will receive an invoice from [facility]. Please contact [facility] at [phone] with questions or concerns regarding your invoice.   IF you received labwork today, you will receive an invoice from [place]. Please contact [facility] at [phone] with questions or concerns regarding your invoice.   Our billing staff will not be able to assist you with questions regarding bills from these companies.  You will be contacted with the lab results as soon as they are available. The fastest way to get your results is to activate your My Chart account. Instructions are  located on the last page of this paperwork. If you have not heard from [place] regarding the results in 2 weeks, please contact this office.

## 2016-11-10 NOTE — Progress Notes (Signed)
   MRN: [id number] DOB: 1980/07/14  Subjective:   Angelica Dillon is a 36 y.o. female presenting for follow up on knee pain, nail issue, allergies.   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 Her left knee continues to swell up and becomes painful with mild-moderate activity. Would like a referral to orthopedics.   Nail issue - Reports 1 year history of abnormal right thumb nail. Patient reports she had discoloration, swelling. She had a manicurist drain her nail which was very painful to the patient. She had pus, bleeding which eventually resolved on its own. Her nail is now friable and she is wondering if anything can be done.   Allergies - Patient was wondering if I could prescribe a nasal steroid for her allergies. She states that she used to get one prescribed but cannot recall the name of the spray.  Angelica Dillon has a current medication list which includes the following prescription(s): albuterol and naproxen sodium. Also is allergic to bupropion. Angelica Dillon  has a past medical history of Cervical dysplasia. Also  has a past surgical history that includes Breast surgery and Colposcopy.  Objective:   Vitals: BP 123/86   Pulse 85   Temp 98 F (36.7 C) (Oral)   Resp 18   Ht 5\' 4"  (1.626 m)   Wt 116 lb (52.6 kg)   LMP 11/04/2016   SpO2 98%   BMI 19.91 kg/m   Physical Exam  Constitutional: She is oriented to person, place, and time. She appears well-developed and well-nourished.  Cardiovascular: Normal rate.   Pulmonary/Chest: Effort normal.  Musculoskeletal:       Left knee: She exhibits swelling (trace swelling). She exhibits normal range of motion, no effusion, no ecchymosis, no deformity, no laceration, no erythema, normal alignment and normal patellar mobility. No tenderness found.  Neurological: She is alert and oriented to person, place, and time.  Skin: Skin is warm and  dry.     Psychiatric: She has a normal mood and affect.   Assessment and Plan :   1. Chronic pain of left knee 2. Pain and swelling of left knee 3. Abnormal x-ray - Refilled Anaprox, referral pending. Offered steroid injection for her knee but patient declined and prefers referral. - Ambulatory referral to Orthopedic Surgery  4. Nail abnormality - I do not believe it is reasonable to remove her thumb nail at this point. I counseled on signs of infection and nail care. Return-to-clinic precautions discussed, patient verbalized understanding.   5. Environmental allergies - Offered script for Nasacort, Flonase, Nasonex but patient declined because she does not believe this is what she had previously.  Angelica Eagles, PA-C [facility] [phone] 11/10/2016 8:55 AM

## 2017-04-09 ENCOUNTER — Ambulatory Visit (INDEPENDENT_AMBULATORY_CARE_PROVIDER_SITE_OTHER): Payer: BC Managed Care – PPO | Admitting: Women's Health

## 2017-04-09 ENCOUNTER — Encounter: Payer: Self-pay | Admitting: Women's Health

## 2017-04-09 VITALS — BP 118/78

## 2017-04-09 DIAGNOSIS — N898 Other specified noninflammatory disorders of vagina: Secondary | ICD-10-CM

## 2017-04-09 DIAGNOSIS — B9689 Other specified bacterial agents as the cause of diseases classified elsewhere: Secondary | ICD-10-CM | POA: Diagnosis not present

## 2017-04-09 DIAGNOSIS — N760 Acute vaginitis: Secondary | ICD-10-CM | POA: Diagnosis not present

## 2017-04-09 DIAGNOSIS — Z720 Tobacco use: Secondary | ICD-10-CM | POA: Diagnosis not present

## 2017-04-09 LAB — WET PREP FOR TRICH, YEAST, CLUE

## 2017-04-09 MED ORDER — VARENICLINE TARTRATE 1 MG PO TABS: 1.0000 mg | ORAL_TABLET | Freq: Two times a day (BID) | ORAL | 3 refills | Status: DC

## 2017-04-09 MED ORDER — METRONIDAZOLE 500 MG PO TABS: 500.0000 mg | ORAL_TABLET | Freq: Two times a day (BID) | ORAL | 0 refills | Status: DC

## 2017-04-09 MED ORDER — VARENICLINE TARTRATE 0.5 MG PO TABS: 0.5000 mg | ORAL_TABLET | Freq: Two times a day (BID) | ORAL | 0 refills | Status: DC

## 2017-04-09 NOTE — Patient Instructions (Signed)

## 2017-04-09 NOTE — Progress Notes (Signed)
36 year old SBF G on P0 with complaint of vaginal discharge with odor. Has had bacteria vaginosis in the past and things this is a recurrence. Denies vaginal itching, irritation, urinary symptoms, abdominal pain or fever. Monthly cycle/condoms, new partner. Smoker, requesting refill of Chantix, states would like to quit, support system in place.  Exam: Appears well. Abdomen soft, nontender, external genitalia within normal limits, no visible discharge or erythema, speculum exam scant clear discharge, wet prep positive for amines, TNTC bacteria. GC/Chlamydia culture taken. Bimanual no CMT or adnexal tenderness.  Bacteria vaginosis  Plan: Flagyl 500 twice daily for 7 days, alcohol precautions reviewed. GC/Chlamydia culture pending. Will have HIV, hepatitis and RPR checked at annual exam which she plans to schedule today. Chantix 0.5 twice daily starting pack and Chantix 1 mg continuing pack both given, instructions for start up reviewed. Instructed to call no relief of discharge. Schedule annual exam with Dr. Phineas Real.

## 2017-04-10 LAB — C. TRACHOMATIS/N. GONORRHOEAE RNA
C. trachomatis RNA, TMA: NOT DETECTED
N. gonorrhoeae RNA, TMA: NOT DETECTED

## 2017-06-01 ENCOUNTER — Encounter: Payer: Self-pay | Admitting: Gynecology

## 2017-06-01 ENCOUNTER — Ambulatory Visit: Payer: BC Managed Care – PPO | Admitting: Gynecology

## 2017-06-01 VITALS — BP 110/80 | Ht 64.0 in | Wt 119.0 lb

## 2017-06-01 DIAGNOSIS — Z113 Encounter for screening for infections with a predominantly sexual mode of transmission: Secondary | ICD-10-CM | POA: Diagnosis not present

## 2017-06-01 DIAGNOSIS — Z01411 Encounter for gynecological examination (general) (routine) with abnormal findings: Secondary | ICD-10-CM | POA: Diagnosis not present

## 2017-06-01 DIAGNOSIS — B9689 Other specified bacterial agents as the cause of diseases classified elsewhere: Secondary | ICD-10-CM | POA: Diagnosis not present

## 2017-06-01 DIAGNOSIS — N912 Amenorrhea, unspecified: Secondary | ICD-10-CM

## 2017-06-01 DIAGNOSIS — Z13220 Encounter for screening for lipoid disorders: Secondary | ICD-10-CM | POA: Diagnosis not present

## 2017-06-01 DIAGNOSIS — N760 Acute vaginitis: Secondary | ICD-10-CM

## 2017-06-01 LAB — WET PREP FOR TRICH, YEAST, CLUE

## 2017-06-01 LAB — PREGNANCY, URINE: PREG TEST UR: NEGATIVE

## 2017-06-01 MED ORDER — CLINDAMYCIN PHOSPHATE 2 % VA CREA: 1.0000 | TOPICAL_CREAM | Freq: Every day | VAGINAL | 0 refills | Status: DC

## 2017-06-01 MED ORDER — METRONIDAZOLE 500 MG PO TABS: 500.0000 mg | ORAL_TABLET | Freq: Once | ORAL | 1 refills | Status: AC

## 2017-06-01 NOTE — Patient Instructions (Signed)
Use the vaginal cream nightly for 7 nights to treat the vaginal infection. Take the metronidazole antibiotic pill after intercourse (1 pill at a time with each episode of intercourse) to see if we cannot prevent recurrence of the vaginal infection  Follow-up in 1 year for annual exam

## 2017-06-01 NOTE — Progress Notes (Signed)
Angelica Dillon 1981-02-08 [id number]        36 y.o.  G1P0010 for annual gynecologic exam.  Also complaining of vaginal discharge with odor.  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  No itching.  No urinary tract symptoms such as frequency dysuria urgency low back pain fever or chills.  Recently treated by Izora Gala beginning of November with Flagyl.  Negative GC/Chlamydia screen at that time.  Requests UPT.  1 or 2 days late for her menses although took Plan B last week after unprotected intercourse.  Does have a history of mildly irregular menses with a will be late up to a week or so.  Had hormonal evaluation the past to include a negative FSH, TSH and prolactin.  Past medical history,surgical history, problem list, medications, allergies, family history and social history were all reviewed and documented as reviewed in the EPIC chart.  ROS:  Performed with pertinent positives and negatives included in the history, assessment and plan.   Additional significant findings : None   Exam: [name] assistant Vitals:   06/01/17 0850  BP: 110/80  Weight: 119 lb (54 kg)  Height: 5\' 4"  (1.626 m)   Body mass index is 20.43 kg/m.  General appearance:  Normal affect, orientation and appearance. Skin: Grossly normal HEENT: Without gross lesions.  No cervical or supraclavicular adenopathy. Thyroid normal.  Lungs:  Clear without wheezing, rales or rhonchi Cardiac: RR, without RMG Abdominal:  Soft, nontender, without masses, guarding, rebound, organomegaly or hernia Breasts:  Examined lying and sitting without masses, retractions, discharge or axillary adenopathy. Pelvic:  Ext, BUS, Vagina: With frothy white discharge  Cervix: Normal  Uterus: Anteverted, normal size, shape and contour, midline and mobile nontender   Adnexa: Without masses or tenderness    Anus and perineum: Normal   Rectovaginal: Normal sphincter tone without palpated masses or  tenderness.    Assessment/Plan:  37 y.o. G75P0010 female for annual gynecologic exam with regular menses, condom contraception.   1. With vaginal discharge.  Wet prep and exam consistent with bacterial vaginosis.  Will treat with Cleocin vaginal cream nightly x7 nights.  Has been treated with Flagyl for her previous occurrences and will switch to a different antibiotic to see if that does not help.  Also discussed her recurrences after intercourse.  Various strategies reviewed.  Ultimately discussed Flagyl 500 mg once with intercourse to see if we can prevent recurrences.  #20 with 1 refill provided.  Follow-up if this does not seem to help. 2. Late for menses by 1 or 2 days.  UPT negative today.  Recent Plan B usage.  Patient will keep menstrual calendar and if remains without menses will follow-up for further evaluation. 3. Contraception.  Patient using condoms although not consistently.  Clearly understands pregnancy risk with this.  Offered and encouraged discussion of alternative contraception which was declined. 4. STD screening.  Requests serum STD screening.  GC/chlamydia last month was negative.  RPR, hepatitis B, hepatitis C and HIV done. 5. Breast exam normal today.  SBE monthly reviewed.  Plan baseline mammogram at age 68.  No strong family history of breast cancer. 6. Pap smear/HPV 01/2014.  No Pap smear done today.  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  Plan repeat Pap smear at 5-year interval per current screening guidelines. 7. Health maintenance.  Patient requests baseline labs.  CBC, CMP, lipid profile ordered along with her STD screening.  Follow-up in 1  year, sooner if her vaginitis continues to be an issue.  Additional time in excess of her routine gynecologic exam was spent in direct face to face counseling and coordination of care in regards to her vaginitis and prevention of recurrent strategies reviewed.    Anastasio Auerbach MD, 9:27 AM  06/01/2017

## 2017-06-01 NOTE — Addendum Note (Signed)
Addended by: Lorine Bears on: 06/01/2017 11:27 AM   Modules accepted: Orders

## 2017-06-04 LAB — COMPREHENSIVE METABOLIC PANEL
AG Ratio: 1.8 (calc) (ref 1.0–2.5)
ALBUMIN MSPROF: 4.4 g/dL (ref 3.6–5.1)
ALKALINE PHOSPHATASE (APISO): 49 U/L (ref 33–115)
ALT: 20 U/L (ref 6–29)
AST: 25 U/L (ref 10–30)
BUN: 9 mg/dL (ref 7–25)
CO2: 23 mmol/L (ref 20–32)
CREATININE: 0.75 mg/dL (ref 0.50–1.10)
Calcium: 9.2 mg/dL (ref 8.6–10.2)
Chloride: 106 mmol/L (ref 98–110)
GLUCOSE: 90 mg/dL (ref 65–99)
Globulin: 2.5 g/dL (calc) (ref 1.9–3.7)
POTASSIUM: 4.4 mmol/L (ref 3.5–5.3)
Sodium: 137 mmol/L (ref 135–146)
TOTAL PROTEIN: 6.9 g/dL (ref 6.1–8.1)
Total Bilirubin: 0.4 mg/dL (ref 0.2–1.2)

## 2017-06-04 LAB — CBC WITH DIFFERENTIAL/PLATELET
Basophils Absolute: 73 cells/uL (ref 0–200)
Basophils Relative: 1.1 %
EOS PCT: 1.8 %
Eosinophils Absolute: 119 cells/uL (ref 15–500)
HCT: 38.8 % (ref 35.0–45.0)
Hemoglobin: 12.6 g/dL (ref 11.7–15.5)
Lymphs Abs: 2508 cells/uL (ref 850–3900)
MCH: 23.6 pg — ABNORMAL LOW (ref 27.0–33.0)
MCHC: 32.5 g/dL (ref 32.0–36.0)
MCV: 72.5 fL — AB (ref 80.0–100.0)
MONOS PCT: 12.5 %
MPV: 10.3 fL (ref 7.5–12.5)
NEUTROS PCT: 46.6 %
Neutro Abs: 3076 cells/uL (ref 1500–7800)
PLATELETS: 292 10*3/uL (ref 140–400)
RBC: 5.35 10*6/uL — AB (ref 3.80–5.10)
RDW: 15.4 % — AB (ref 11.0–15.0)
TOTAL LYMPHOCYTE: 38 %
WBC mixed population: 825 cells/uL (ref 200–950)
WBC: 6.6 10*3/uL (ref 3.8–10.8)

## 2017-06-04 LAB — HEPATITIS C ANTIBODY
HEP C AB: NONREACTIVE
SIGNAL TO CUT-OFF: 0.03 (ref <1.00)

## 2017-06-04 LAB — LIPID PANEL
CHOL/HDL RATIO: 2.4 (calc) (ref <5.0)
CHOLESTEROL: 143 mg/dL (ref <200)
HDL: 60 mg/dL (ref >50)
LDL CHOLESTEROL (CALC): 64 mg/dL
NON-HDL CHOLESTEROL (CALC): 83 mg/dL (ref <130)
Triglycerides: 100 mg/dL (ref <150)

## 2017-06-04 LAB — RPR: RPR: NONREACTIVE

## 2017-06-04 LAB — HEPATITIS B SURFACE ANTIGEN: HEP B S AG: NONREACTIVE

## 2017-06-04 LAB — HIV ANTIBODY (ROUTINE TESTING W REFLEX): HIV 1&2 Ab, 4th Generation: NONREACTIVE

## 2018-01-03 ENCOUNTER — Encounter: Payer: Self-pay | Admitting: Gynecology

## 2018-01-03 ENCOUNTER — Ambulatory Visit: Payer: BC Managed Care – PPO | Admitting: Gynecology

## 2018-01-03 VITALS — BP 122/80

## 2018-01-03 DIAGNOSIS — N912 Amenorrhea, unspecified: Secondary | ICD-10-CM | POA: Diagnosis not present

## 2018-01-03 LAB — PREGNANCY, URINE: Preg Test, Ur: NEGATIVE

## 2018-01-03 MED ORDER — METRONIDAZOLE 500 MG PO TABS: ORAL_TABLET | ORAL | 1 refills | Status: DC

## 2018-01-03 NOTE — Progress Notes (Signed)
    Angelica Dillon 22-Dec-1980 [id number]        37 y.o.  G1P0010 presents with LMP 11/21/2017.  Using condoms for contraception.  Had to faint positive UPT's over the last 2 days.  Does have a history of skipped menses in the past requiring progesterone withdrawal.  Past medical history,surgical history, problem list, medications, allergies, family history and social history were all reviewed and documented in the EPIC chart.  Directed ROS with pertinent positives and negatives documented in the history of present illness/assessment and plan.  Exam: Angelica Dillon assistant Vitals:   01/03/18 1504  BP: 122/80   General appearance:  Normal Abdomen soft nontender without masses guarding rebound Pelvic external BUS vagina normal.  Cervix normal.  Uterus normal size midline mobile nontender.  Adnexa without masses or tenderness.  Assessment/Plan:  37 y.o. G1P0010 with above history.  UPT negative here.  Will check qualitative hCG.  Discussed possible chemical pregnancy with SAB.  Also reviewed possibility of false positive home pregnancy test.  If qualitative hCG negative then we will plan Provera 10 mg x 10 days.  I again rediscussed failure risks with condoms and she understands this and desires no other contraception.  If qualitative hCG positive then will follow initially with quantitative hCGs.  The patient also has been using Flagyl 500 mg postcoitally to prevent recurrent BV and has done so successfully.  She is running out and asked for refill.  #30 with 1 refill provided.    Angelica Auerbach MD, 3:27 PM 01/03/2018

## 2018-01-03 NOTE — Patient Instructions (Signed)
Office will contact you with the blood pregnancy results.

## 2018-01-04 LAB — HCG, QUANTITATIVE, PREGNANCY: HCG, Total, QN: <2 m[IU]/mL

## 2018-11-15 ENCOUNTER — Encounter: Payer: BC Managed Care – PPO | Admitting: Gynecology

## 2018-11-26 ENCOUNTER — Encounter: Payer: BC Managed Care – PPO | Admitting: Gynecology

## 2018-11-27 ENCOUNTER — Other Ambulatory Visit: Payer: Self-pay

## 2018-11-28 ENCOUNTER — Encounter: Payer: Self-pay | Admitting: Gynecology

## 2018-11-28 ENCOUNTER — Ambulatory Visit (INDEPENDENT_AMBULATORY_CARE_PROVIDER_SITE_OTHER): Payer: BC Managed Care – PPO | Admitting: Gynecology

## 2018-11-28 VITALS — BP 126/78 | Ht 64.0 in | Wt 105.0 lb

## 2018-11-28 DIAGNOSIS — Z1151 Encounter for screening for human papillomavirus (HPV): Secondary | ICD-10-CM | POA: Diagnosis not present

## 2018-11-28 DIAGNOSIS — F419 Anxiety disorder, unspecified: Secondary | ICD-10-CM | POA: Diagnosis not present

## 2018-11-28 DIAGNOSIS — Z113 Encounter for screening for infections with a predominantly sexual mode of transmission: Secondary | ICD-10-CM

## 2018-11-28 DIAGNOSIS — Z01419 Encounter for gynecological examination (general) (routine) without abnormal findings: Secondary | ICD-10-CM

## 2018-11-28 DIAGNOSIS — Z13220 Encounter for screening for lipoid disorders: Secondary | ICD-10-CM | POA: Diagnosis not present

## 2018-11-28 MED ORDER — FLUOXETINE HCL 20 MG PO CAPS: 20.0000 mg | ORAL_CAPSULE | Freq: Every day | ORAL | 12 refills | Status: DC

## 2018-11-28 NOTE — Addendum Note (Signed)
Addended by: Nelva Nay on: 11/28/2018 09:46 AM   Modules accepted: Orders

## 2018-11-28 NOTE — Patient Instructions (Addendum)
Start on the medication as we discussed.  Call if you do not feel that you are getting an adequate response to your anxiety.  Call if you have worsening symptoms.  Call to Schedule your mammogram  Facilities in [place]: 1)  [facility] [address] [place] Phone: [phone] 2)  Dr. Isaiah Blakes at [facility] [address] Phone: [phone]     Mammogram A mammogram is an X-ray test to find changes in a woman's breast. You should get a mammogram if:  You are 25 years of age or older  You have risk factors.   Your doctor recommends that you have one.  BEFORE THE TEST  Do not schedule the test the week before your period, especially if your breasts are sore during this time.  On the day of your mammogram:  Wash your breasts and armpits well. After washing, do not put on any deodorant or talcum powder on until after your test.   Eat and drink as you usually do.   Take your medicines as usual.   If you are diabetic and take insulin, make sure you:   Eat before coming for your test.   Take your insulin as usual.   If you cannot keep your appointment, call before the appointment to cancel. Schedule another appointment.  TEST  You will need to undress from the waist up. You will put on a hospital gown.   Your breast will be put on the mammogram machine, and it will press firmly on your breast with a piece of plastic called a compression paddle. This will make your breast flatter so that the machine can X-ray all parts of your breast.   Both breasts will be X-rayed. Each breast will be X-rayed from above and from the side. An X-ray might need to be taken again if the picture is not good enough.   The mammogram will last about 15 to 30 minutes.  AFTER THE TEST Finding out the results of your test Ask when your test results will be ready. Make sure you get your test results.  Document Released:  08/18/2008 Document Revised: 05/11/2011 Document Reviewed: 08/18/2008 [facility] Patient Information 2012 [place].

## 2018-11-28 NOTE — Progress Notes (Signed)
    Angelica Dillon [birth year] [id number]        38 y.o.  G1P0010 for annual gynecologic exam.  Doing well from a gynecologic standpoint.  Is having issues with anxiety and depression over the last several months that she is attributing to the coronavirus situation, the social unrest in [place] and some family issues.  She is having crying spells and difficulty sleeping.  She has lost some weight noting her weight 2018 was 119 and she is 105 pounds now.  She finds that she is having difficulty eating because of her anxiety.  She has no feeling of self-harm or hurting others.  It is now starting to interfere with her daily life functions.  Past medical history,surgical history, problem list, medications, allergies, family history and social history were all reviewed and documented as reviewed in the EPIC chart.  ROS:  Performed with pertinent positives and negatives included in the history, assessment and plan.   Additional significant findings : None   Exam: Caryn Bee assistant Vitals:   11/28/18 0833  BP: 126/78  Weight: 105 lb (47.6 kg)  Height: 5\' 4"  (1.626 m)   Body mass index is 18.02 kg/m.  General appearance:  Normal affect, orientation and appearance. Skin: Grossly normal HEENT: Without gross lesions.  No cervical or supraclavicular adenopathy. Thyroid normal.  Lungs:  Clear without wheezing, rales or rhonchi Cardiac: RR, without RMG Abdominal:  Soft, nontender, without masses, guarding, rebound, organomegaly or hernia Breasts:  Examined lying and sitting without masses, retractions, discharge or axillary adenopathy. Pelvic:  Ext, BUS, Vagina: Normal  Cervix: Normal.  Pap smear/HPV  Uterus: Anteverted, normal size, shape and contour, midline and mobile nontender   Adnexa: Without masses or tenderness    Anus and perineum: Normal   Rectovaginal: Normal sphincter tone without palpated masses or tenderness.    Assessment/Plan:  38 y.o. G45P0010 female for annual gynecologic  exam.  With regular menses, condom contraception  1. Anxiety/depression as discussed above.  Reviewed options with the patient to include starting medication, referral for mental health evaluation and treatment, combination of the 2.  At this point the patient would prefer to start medication and see how she does with this.  If she continues to feel poorly or certainly starts to feel worse she knows to call ASAP for referral to mental health.  Discussed several possible medications.  Will initiate fluoxetine 20 mg daily.  She will call in 2 weeks if she does not notice an improvement in her symptoms.  We talked about adjusting doses or trying a different medication.  Need to call ASAP if worsening depression/anxiety stressed.  Will check baseline TSH today. 2. Pap smear/HPV 2015.  Pap smear/HPV today.  History of dysplasia as a teenager with normal Pap smears since. 3. Contraception.  Continues with condoms which is acceptable to her.  Failure risks acknowledged. 4. STD screening requested.  No known exposure but would like both GC and chlamydia as well as serum screening to include HIV RPR hepatitis B hepatitis C. 5. Recommended screening mammogram as she has turned 38 and she agrees to call and schedule.  Names and numbers provided.  Breast exam normal today. 6. Health maintenance.  Baseline CBC, CMP and lipid profile ordered with the above blood work.  Follow-up as far as response to her medication.  Follow-up in 1 year for annual exam.   Anastasio Auerbach MD, 9:04 AM 11/28/2018

## 2018-11-29 LAB — CBC WITH DIFFERENTIAL/PLATELET
Absolute Monocytes: 748 cells/uL (ref 200–950)
Basophils Absolute: 41 cells/uL (ref 0–200)
Basophils Relative: 0.7 %
Eosinophils Absolute: 52 cells/uL (ref 15–500)
Eosinophils Relative: 0.9 %
HCT: 41.4 % (ref 35.0–45.0)
Hemoglobin: 13.2 g/dL (ref 11.7–15.5)
Lymphs Abs: 2564 cells/uL (ref 850–3900)
MCH: 23.5 pg — ABNORMAL LOW (ref 27.0–33.0)
MCHC: 31.9 g/dL — ABNORMAL LOW (ref 32.0–36.0)
MCV: 73.7 fL — ABNORMAL LOW (ref 80.0–100.0)
MPV: 10.7 fL (ref 7.5–12.5)
Monocytes Relative: 12.9 %
Neutro Abs: 2395 cells/uL (ref 1500–7800)
Neutrophils Relative %: 41.3 %
Platelets: 298 10*3/uL (ref 140–400)
RBC: 5.62 10*6/uL — ABNORMAL HIGH (ref 3.80–5.10)
RDW: 14.6 % (ref 11.0–15.0)
Total Lymphocyte: 44.2 %
WBC: 5.8 10*3/uL (ref 3.8–10.8)

## 2018-11-29 LAB — COMPREHENSIVE METABOLIC PANEL
AG Ratio: 1.5 (calc) (ref 1.0–2.5)
ALT: 14 U/L (ref 6–29)
AST: 18 U/L (ref 10–30)
Albumin: 4.8 g/dL (ref 3.6–5.1)
Alkaline phosphatase (APISO): 55 U/L (ref 31–125)
BUN: 8 mg/dL (ref 7–25)
CO2: 23 mmol/L (ref 20–32)
Calcium: 9.6 mg/dL (ref 8.6–10.2)
Chloride: 103 mmol/L (ref 98–110)
Creat: 0.77 mg/dL (ref 0.50–1.10)
Globulin: 3.1 g/dL (calc) (ref 1.9–3.7)
Glucose, Bld: 82 mg/dL (ref 65–99)
Potassium: 3.7 mmol/L (ref 3.5–5.3)
Sodium: 136 mmol/L (ref 135–146)
Total Bilirubin: 0.6 mg/dL (ref 0.2–1.2)
Total Protein: 7.9 g/dL (ref 6.1–8.1)

## 2018-11-29 LAB — PAP IG AND HPV HIGH-RISK: HPV DNA High Risk: NOT DETECTED

## 2018-11-29 LAB — C. TRACHOMATIS/N. GONORRHOEAE RNA
C. trachomatis RNA, TMA: NOT DETECTED
N. gonorrhoeae RNA, TMA: NOT DETECTED

## 2018-11-29 LAB — HIV ANTIBODY (ROUTINE TESTING W REFLEX): HIV 1&2 Ab, 4th Generation: NONREACTIVE

## 2018-11-29 LAB — TSH: TSH: 1.44 mIU/L

## 2018-11-29 LAB — LIPID PANEL
Cholesterol: 181 mg/dL (ref <200)
HDL: 72 mg/dL (ref >=50)
LDL Cholesterol (Calc): 91 mg/dL (calc)
Non-HDL Cholesterol (Calc): 109 mg/dL (calc) (ref <130)
Total CHOL/HDL Ratio: 2.5 (calc) (ref <5.0)
Triglycerides: 89 mg/dL (ref <150)

## 2018-11-29 LAB — RPR: RPR Ser Ql: NONREACTIVE

## 2018-11-29 LAB — HEPATITIS C ANTIBODY
Hepatitis C Ab: NONREACTIVE
SIGNAL TO CUT-OFF: 0.06 (ref <1.00)

## 2018-11-29 LAB — HEPATITIS B SURFACE ANTIGEN: Hepatitis B Surface Ag: NONREACTIVE

## 2019-02-25 ENCOUNTER — Encounter: Payer: Self-pay | Admitting: Gynecology

## 2019-04-24 ENCOUNTER — Telehealth: Payer: Self-pay | Admitting: *Deleted

## 2019-04-24 NOTE — Telephone Encounter (Signed)
Patient called has been doing well on Prozac 20 mg capsule, told to call if she thought increase was needed. Patient would like increase, she is not feeling any worse, just feels a slight adjust would be great. Please advise

## 2019-04-24 NOTE — Telephone Encounter (Signed)
Tell patient unfortunately they do not make a 30 mg dose but go straight to a 40 mg.  I think given from what she describes increasing a little bit to the 30 mg would be good.  I would recommend prescribing both the 20 mg tablet and a 10 mg tablet in for her to take both daily.  Refill enough through her next annual exam

## 2019-04-25 MED ORDER — FLUOXETINE HCL 10 MG PO CAPS: 10.0000 mg | ORAL_CAPSULE | Freq: Every day | ORAL | 6 refills | Status: AC

## 2019-04-25 NOTE — Telephone Encounter (Signed)
Patient informed, Rx sent.  

## 2019-06-02 ENCOUNTER — Other Ambulatory Visit: Payer: Self-pay

## 2019-06-03 ENCOUNTER — Encounter: Payer: Self-pay | Admitting: Women's Health

## 2019-06-03 ENCOUNTER — Ambulatory Visit: Payer: BC Managed Care – PPO | Admitting: Women's Health

## 2019-06-03 VITALS — BP 118/82

## 2019-06-03 DIAGNOSIS — N912 Amenorrhea, unspecified: Secondary | ICD-10-CM

## 2019-06-03 DIAGNOSIS — Z3201 Encounter for pregnancy test, result positive: Secondary | ICD-10-CM | POA: Diagnosis not present

## 2019-06-03 DIAGNOSIS — F17200 Nicotine dependence, unspecified, uncomplicated: Secondary | ICD-10-CM

## 2019-06-03 LAB — PREGNANCY, URINE: Preg Test, Ur: POSITIVE — AB

## 2019-06-03 NOTE — Patient Instructions (Signed)
First Trimester of Pregnancy The first trimester of pregnancy is from week 1 until the end of week 13 (months 1 through 3). A week after a sperm fertilizes an egg, the egg will implant on the wall of the uterus. This embryo will begin to develop into a baby. Genes from you and your partner will form the baby. The female genes will determine whether the baby will be a boy or a girl. At 6-8 weeks, the eyes and face will be formed, and the heartbeat can be seen on ultrasound. At the end of 12 weeks, all the baby's organs will be formed. Now that you are pregnant, you will want to do everything you can to have a healthy baby. Two of the most important things are to get good prenatal care and to follow your health care provider's instructions. Prenatal care is all the medical care you receive before the baby's birth. This care will help prevent, find, and treat any problems during the pregnancy and childbirth. Body changes during your first trimester Your body goes through many changes during pregnancy. The changes vary from woman to woman.  You may gain or lose a couple of pounds at first.  You may feel sick to your stomach (nauseous) and you may throw up (vomit). If the vomiting is uncontrollable, call your health care provider.  You may tire easily.  You may develop headaches that can be relieved by medicines. All medicines should be approved by your health care provider.  You may urinate more often. Painful urination may mean you have a bladder infection.  You may develop heartburn as a result of your pregnancy.  You may develop constipation because certain hormones are causing the muscles that push stool through your intestines to slow down.  You may develop hemorrhoids or swollen veins (varicose veins).  Your breasts may begin to grow larger and become tender. Your nipples may stick out more, and the tissue that surrounds them (areola) may become darker.  Your gums may bleed and may be  sensitive to brushing and flossing.  Dark spots or blotches (chloasma, mask of pregnancy) may develop on your face. This will likely fade after the baby is born.  Your menstrual periods will stop.  You may have a loss of appetite.  You may develop cravings for certain kinds of food.  You may have changes in your emotions from day to day, such as being excited to be pregnant or being concerned that something may go wrong with the pregnancy and baby.  You may have more vivid and strange dreams.  You may have changes in your hair. These can include thickening of your hair, rapid growth, and changes in texture. Some women also have hair loss during or after pregnancy, or hair that feels dry or thin. Your hair will most likely return to normal after your baby is born. What to expect at prenatal visits During a routine prenatal visit:  You will be weighed to make sure you and the baby are growing normally.  Your blood pressure will be taken.  Your abdomen will be measured to track your baby's growth.  The fetal heartbeat will be listened to between weeks 10 and 14 of your pregnancy.  Test results from any previous visits will be discussed. Your health care provider may ask you:  How you are feeling.  If you are feeling the baby move.  If you have had any abnormal symptoms, such as leaking fluid, bleeding, severe headaches, or abdominal   cramping.  If you are using any tobacco products, including cigarettes, chewing tobacco, and electronic cigarettes.  If you have any questions. Other tests that may be performed during your first trimester include:  Blood tests to find your blood type and to check for the presence of any previous infections. The tests will also be used to check for low iron levels (anemia) and protein on red blood cells (Rh antibodies). Depending on your risk factors, or if you previously had diabetes during pregnancy, you may have tests to check for high blood sugar  that affects pregnant women (gestational diabetes).  Urine tests to check for infections, diabetes, or protein in the urine.  An ultrasound to confirm the proper growth and development of the baby.  Fetal screens for spinal cord problems (spina bifida) and Down syndrome.  HIV (human immunodeficiency virus) testing. Routine prenatal testing includes screening for HIV, unless you choose not to have this test.  You may need other tests to make sure you and the baby are doing well. Follow these instructions at home: Medicines  Follow your health care provider's instructions regarding medicine use. Specific medicines may be either safe or unsafe to take during pregnancy.  Take a prenatal vitamin that contains at least 600 micrograms (mcg) of folic acid.  If you develop constipation, try taking a stool softener if your health care provider approves. Eating and drinking   Eat a balanced diet that includes fresh fruits and vegetables, whole grains, good sources of protein such as meat, eggs, or tofu, and low-fat dairy. Your health care provider will help you determine the amount of weight gain that is right for you.  Avoid raw meat and uncooked cheese. These carry germs that can cause birth defects in the baby.  Eating four or five small meals rather than three large meals a day may help relieve nausea and vomiting. If you start to feel nauseous, eating a few soda crackers can be helpful. Drinking liquids between meals, instead of during meals, also seems to help ease nausea and vomiting.  Limit foods that are high in fat and processed sugars, such as fried and sweet foods.  To prevent constipation: ? Eat foods that are high in fiber, such as fresh fruits and vegetables, whole grains, and beans. ? Drink enough fluid to keep your urine clear or pale yellow. Activity  Exercise only as directed by your health care provider. Most women can continue their usual exercise routine during  pregnancy. Try to exercise for 30 minutes at least 5 days a week. Exercising will help you: ? Control your weight. ? Stay in shape. ? Be prepared for labor and delivery.  Experiencing pain or cramping in the lower abdomen or lower back is a good sign that you should stop exercising. Check with your health care provider before continuing with normal exercises.  Try to avoid standing for long periods of time. Move your legs often if you must stand in one place for a long time.  Avoid heavy lifting.  Wear low-heeled shoes and practice good posture.  You may continue to have sex unless your health care provider tells you not to. Relieving pain and discomfort  Wear a good support bra to relieve breast tenderness.  Take warm sitz baths to soothe any pain or discomfort caused by hemorrhoids. Use hemorrhoid cream if your health care provider approves.  Rest with your legs elevated if you have leg cramps or low back pain.  If you develop varicose veins in   your legs, wear support hose. Elevate your feet for 15 minutes, 3-4 times a day. Limit salt in your diet. Prenatal care  Schedule your prenatal visits by the twelfth week of pregnancy. They are usually scheduled monthly at first, then more often in the last 2 months before delivery.  Write down your questions. Take them to your prenatal visits.  Keep all your prenatal visits as told by your health care provider. This is important. Safety  Wear your seat belt at all times when driving.  Make a list of emergency phone numbers, including numbers for family, friends, the hospital, and police and fire departments. General instructions  Ask your health care provider for a referral to a local prenatal education class. Begin classes no later than the beginning of month 6 of your pregnancy.  Ask for help if you have counseling or nutritional needs during pregnancy. Your health care provider can offer advice or refer you to specialists for help  with various needs.  Do not use hot tubs, steam rooms, or saunas.  Do not douche or use tampons or scented sanitary pads.  Do not cross your legs for long periods of time.  Avoid cat litter boxes and soil used by cats. These carry germs that can cause birth defects in the baby and possibly loss of the fetus by miscarriage or stillbirth.  Avoid all smoking, herbs, alcohol, and medicines not prescribed by your health care provider. Chemicals in these products affect the formation and growth of the baby.  Do not use any products that contain nicotine or tobacco, such as cigarettes and e-cigarettes. If you need help quitting, ask your health care provider. You may receive counseling support and other resources to help you quit.  Schedule a dentist appointment. At home, brush your teeth with a soft toothbrush and be gentle when you floss. Contact a health care provider if:  You have dizziness.  You have mild pelvic cramps, pelvic pressure, or nagging pain in the abdominal area.  You have persistent nausea, vomiting, or diarrhea.  You have a bad smelling vaginal discharge.  You have pain when you urinate.  You notice increased swelling in your face, hands, legs, or ankles.  You are exposed to fifth disease or chickenpox.  You are exposed to German measles (rubella) and have never had it. Get help right away if:  You have a fever.  You are leaking fluid from your vagina.  You have spotting or bleeding from your vagina.  You have severe abdominal cramping or pain.  You have rapid weight gain or loss.  You vomit blood or material that looks like coffee grounds.  You develop a severe headache.  You have shortness of breath.  You have any kind of trauma, such as from a fall or a car accident. Summary  The first trimester of pregnancy is from week 1 until the end of week 13 (months 1 through 3).  Your body goes through many changes during pregnancy. The changes vary from  woman to woman.  You will have routine prenatal visits. During those visits, your health care provider will examine you, discuss any test results you may have, and talk with you about how you are feeling. This information is not intended to replace advice given to you by your health care provider. Make sure you discuss any questions you have with your health care provider. Document Released: 05/16/2001 Document Revised: 05/04/2017 Document Reviewed: 05/03/2016 Elsevier Patient Education  2020 [employer]  

## 2019-06-04 DIAGNOSIS — F17200 Nicotine dependence, unspecified, uncomplicated: Secondary | ICD-10-CM | POA: Insufficient documentation

## 2019-06-04 LAB — URINALYSIS, COMPLETE W/RFL CULTURE
Bacteria, UA: NONE SEEN /HPF
Bilirubin Urine: NEGATIVE
Glucose, UA: NEGATIVE
Hgb urine dipstick: NEGATIVE
Hyaline Cast: NONE SEEN /LPF
Leukocyte Esterase: NEGATIVE
Nitrites, Initial: NEGATIVE
Protein, ur: NEGATIVE
RBC / HPF: NONE SEEN /HPF (ref 0–2)
Specific Gravity, Urine: 1.02 (ref 1.001–1.03)
pH: 6 (ref 5.0–8.0)

## 2019-06-04 NOTE — Progress Notes (Addendum)
38 year old old S BF G1, P0 presents with positive home UPT.  Monthly cycle, condoms inconsistently same partner.  11/2018 started on Prozac 20 mg increased to 30 mg in November, which has been helping and questions if she should remain on it.  LMP 04/26/2019 1-1/2 days of a light cycle, October 21 4-day cycle normal.  Questions if she may be further along.  Denies any spotting, vaginal discharge, abdominal pain, urinary symptoms, nausea or fever.  Reports breast tenderness.  Happy with pregnancy.  Smoker, no other health problems.  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  Exam: Appears well, happy.  UPT positive.  Abdomen soft, nontender, external genitalia within normal limits, speculum exam no discharge, bimanual uterus small less than 8 weeks size.    Early pregnancy  Plan: Schedule viability/dating ultrasound.  Safe pregnancy behaviors reviewed, no alcohol, avoid over-the-counter medications other than plain Tylenol and Robitussin..  Reviewed importance of no or less smoking.  Decrease Prozac gradually to come off.  Reviewed importance of self-care, leisure activities.  Congratulations given.  Prenatal vitamin daily encouraged.

## 2019-06-05 ENCOUNTER — Other Ambulatory Visit: Payer: Self-pay

## 2019-06-05 ENCOUNTER — Ambulatory Visit: Payer: BC Managed Care – PPO | Admitting: Gynecology

## 2019-06-09 ENCOUNTER — Encounter: Payer: Self-pay | Admitting: Women's Health

## 2019-06-09 ENCOUNTER — Other Ambulatory Visit: Payer: Self-pay

## 2019-06-09 ENCOUNTER — Ambulatory Visit (INDEPENDENT_AMBULATORY_CARE_PROVIDER_SITE_OTHER): Payer: BC Managed Care – PPO

## 2019-06-09 ENCOUNTER — Ambulatory Visit: Payer: BC Managed Care – PPO | Admitting: Women's Health

## 2019-06-09 VITALS — BP 122/80

## 2019-06-09 DIAGNOSIS — N912 Amenorrhea, unspecified: Secondary | ICD-10-CM | POA: Diagnosis not present

## 2019-06-09 DIAGNOSIS — O3680X0 Pregnancy with inconclusive fetal viability, not applicable or unspecified: Secondary | ICD-10-CM | POA: Diagnosis not present

## 2019-06-09 DIAGNOSIS — D252 Subserosal leiomyoma of uterus: Secondary | ICD-10-CM

## 2019-06-09 DIAGNOSIS — Z3A08 8 weeks gestation of pregnancy: Secondary | ICD-10-CM | POA: Diagnosis not present

## 2019-06-09 DIAGNOSIS — N854 Malposition of uterus: Secondary | ICD-10-CM

## 2019-06-09 NOTE — Patient Instructions (Signed)
First Trimester of Pregnancy The first trimester of pregnancy is from week 1 until the end of week 13 (months 1 through 3). A week after a sperm fertilizes an egg, the egg will implant on the wall of the uterus. This embryo will begin to develop into a baby. Genes from you and your partner will form the baby. The female genes will determine whether the baby will be a boy or a girl. At 6-8 weeks, the eyes and face will be formed, and the heartbeat can be seen on ultrasound. At the end of 12 weeks, all the baby's organs will be formed. Now that you are pregnant, you will want to do everything you can to have a healthy baby. Two of the most important things are to get good prenatal care and to follow your health care provider's instructions. Prenatal care is all the medical care you receive before the baby's birth. This care will help prevent, find, and treat any problems during the pregnancy and childbirth. Body changes during your first trimester Your body goes through many changes during pregnancy. The changes vary from woman to woman.  You may gain or lose a couple of pounds at first.  You may feel sick to your stomach (nauseous) and you may throw up (vomit). If the vomiting is uncontrollable, call your health care provider.  You may tire easily.  You may develop headaches that can be relieved by medicines. All medicines should be approved by your health care provider.  You may urinate more often. Painful urination may mean you have a bladder infection.  You may develop heartburn as a result of your pregnancy.  You may develop constipation because certain hormones are causing the muscles that push stool through your intestines to slow down.  You may develop hemorrhoids or swollen veins (varicose veins).  Your breasts may begin to grow larger and become tender. Your nipples may stick out more, and the tissue that surrounds them (areola) may become darker.  Your gums may bleed and may be  sensitive to brushing and flossing.  Dark spots or blotches (chloasma, mask of pregnancy) may develop on your face. This will likely fade after the baby is born.  Your menstrual periods will stop.  You may have a loss of appetite.  You may develop cravings for certain kinds of food.  You may have changes in your emotions from day to day, such as being excited to be pregnant or being concerned that something may go wrong with the pregnancy and baby.  You may have more vivid and strange dreams.  You may have changes in your hair. These can include thickening of your hair, rapid growth, and changes in texture. Some women also have hair loss during or after pregnancy, or hair that feels dry or thin. Your hair will most likely return to normal after your baby is born. What to expect at prenatal visits During a routine prenatal visit:  You will be weighed to make sure you and the baby are growing normally.  Your blood pressure will be taken.  Your abdomen will be measured to track your baby's growth.  The fetal heartbeat will be listened to between weeks 10 and 14 of your pregnancy.  Test results from any previous visits will be discussed. Your health care provider may ask you:  How you are feeling.  If you are feeling the baby move.  If you have had any abnormal symptoms, such as leaking fluid, bleeding, severe headaches, or abdominal   cramping.  If you are using any tobacco products, including cigarettes, chewing tobacco, and electronic cigarettes.  If you have any questions. Other tests that may be performed during your first trimester include:  Blood tests to find your blood type and to check for the presence of any previous infections. The tests will also be used to check for low iron levels (anemia) and protein on red blood cells (Rh antibodies). Depending on your risk factors, or if you previously had diabetes during pregnancy, you may have tests to check for high blood sugar  that affects pregnant women (gestational diabetes).  Urine tests to check for infections, diabetes, or protein in the urine.  An ultrasound to confirm the proper growth and development of the baby.  Fetal screens for spinal cord problems (spina bifida) and Down syndrome.  HIV (human immunodeficiency virus) testing. Routine prenatal testing includes screening for HIV, unless you choose not to have this test.  You may need other tests to make sure you and the baby are doing well. Follow these instructions at home: Medicines  Follow your health care provider's instructions regarding medicine use. Specific medicines may be either safe or unsafe to take during pregnancy.  Take a prenatal vitamin that contains at least 600 micrograms (mcg) of folic acid.  If you develop constipation, try taking a stool softener if your health care provider approves. Eating and drinking   Eat a balanced diet that includes fresh fruits and vegetables, whole grains, good sources of protein such as meat, eggs, or tofu, and low-fat dairy. Your health care provider will help you determine the amount of weight gain that is right for you.  Avoid raw meat and uncooked cheese. These carry germs that can cause birth defects in the baby.  Eating four or five small meals rather than three large meals a day may help relieve nausea and vomiting. If you start to feel nauseous, eating a few soda crackers can be helpful. Drinking liquids between meals, instead of during meals, also seems to help ease nausea and vomiting.  Limit foods that are high in fat and processed sugars, such as fried and sweet foods.  To prevent constipation: ? Eat foods that are high in fiber, such as fresh fruits and vegetables, whole grains, and beans. ? Drink enough fluid to keep your urine clear or pale yellow. Activity  Exercise only as directed by your health care provider. Most women can continue their usual exercise routine during  pregnancy. Try to exercise for 30 minutes at least 5 days a week. Exercising will help you: ? Control your weight. ? Stay in shape. ? Be prepared for labor and delivery.  Experiencing pain or cramping in the lower abdomen or lower back is a good sign that you should stop exercising. Check with your health care provider before continuing with normal exercises.  Try to avoid standing for long periods of time. Move your legs often if you must stand in one place for a long time.  Avoid heavy lifting.  Wear low-heeled shoes and practice good posture.  You may continue to have sex unless your health care provider tells you not to. Relieving pain and discomfort  Wear a good support bra to relieve breast tenderness.  Take warm sitz baths to soothe any pain or discomfort caused by hemorrhoids. Use hemorrhoid cream if your health care provider approves.  Rest with your legs elevated if you have leg cramps or low back pain.  If you develop varicose veins in   your legs, wear support hose. Elevate your feet for 15 minutes, 3-4 times a day. Limit salt in your diet. Prenatal care  Schedule your prenatal visits by the twelfth week of pregnancy. They are usually scheduled monthly at first, then more often in the last 2 months before delivery.  Write down your questions. Take them to your prenatal visits.  Keep all your prenatal visits as told by your health care provider. This is important. Safety  Wear your seat belt at all times when driving.  Make a list of emergency phone numbers, including numbers for family, friends, the hospital, and police and fire departments. General instructions  Ask your health care provider for a referral to a local prenatal education class. Begin classes no later than the beginning of month 6 of your pregnancy.  Ask for help if you have counseling or nutritional needs during pregnancy. Your health care provider can offer advice or refer you to specialists for help  with various needs.  Do not use hot tubs, steam rooms, or saunas.  Do not douche or use tampons or scented sanitary pads.  Do not cross your legs for long periods of time.  Avoid cat litter boxes and soil used by cats. These carry germs that can cause birth defects in the baby and possibly loss of the fetus by miscarriage or stillbirth.  Avoid all smoking, herbs, alcohol, and medicines not prescribed by your health care provider. Chemicals in these products affect the formation and growth of the baby.  Do not use any products that contain nicotine or tobacco, such as cigarettes and e-cigarettes. If you need help quitting, ask your health care provider. You may receive counseling support and other resources to help you quit.  Schedule a dentist appointment. At home, brush your teeth with a soft toothbrush and be gentle when you floss. Contact a health care provider if:  You have dizziness.  You have mild pelvic cramps, pelvic pressure, or nagging pain in the abdominal area.  You have persistent nausea, vomiting, or diarrhea.  You have a bad smelling vaginal discharge.  You have pain when you urinate.  You notice increased swelling in your face, hands, legs, or ankles.  You are exposed to fifth disease or chickenpox.  You are exposed to German measles (rubella) and have never had it. Get help right away if:  You have a fever.  You are leaking fluid from your vagina.  You have spotting or bleeding from your vagina.  You have severe abdominal cramping or pain.  You have rapid weight gain or loss.  You vomit blood or material that looks like coffee grounds.  You develop a severe headache.  You have shortness of breath.  You have any kind of trauma, such as from a fall or a car accident. Summary  The first trimester of pregnancy is from week 1 until the end of week 13 (months 1 through 3).  Your body goes through many changes during pregnancy. The changes vary from  woman to woman.  You will have routine prenatal visits. During those visits, your health care provider will examine you, discuss any test results you may have, and talk with you about how you are feeling. This information is not intended to replace advice given to you by your health care provider. Make sure you discuss any questions you have with your health care provider. Document Revised: 05/04/2017 Document Reviewed: 05/03/2016 Elsevier Patient Education  2020 [employer]  

## 2019-06-09 NOTE — Progress Notes (Signed)
39 year old SBF G2 P0 presents for viability/dating ultrasound.  LMP 04/25/2019 1-1/2 days, short light cycle, normal 4-day cycle in October.  Denies any spotting, abdominal cramping, discharge, urinary symptoms, back pain or fever.  Pleased with pregnancy.  Decreasing smoking and Prozac dose.  Exam: Appears well.  Ultrasound: Anteverted uterus, small 7 mm subserous fibroid posterior, single 5 to 6-week size intrauterine gestational sac normal yolk sac, too early for CRL or FHT.  Cervix long and closed.  Adnexa within normal limits, resolving corpus luteum on right side.  No free fluid .  Early pregnancy 6 weeks 3 days by dates less than 6 weeks per ultrasound  Plan: Options reviewed, states would like to return for second ultrasound to check FHM and then will schedule new OB appointment.  Aware we no longer deliver.  Continue prenatal vitamins daily, continue decreasing smoking, Prozac.  Encouraged self-care and leisure activities.  Safe pregnancy behaviors discussed.

## 2019-06-23 ENCOUNTER — Other Ambulatory Visit: Payer: Self-pay

## 2019-06-24 ENCOUNTER — Ambulatory Visit (INDEPENDENT_AMBULATORY_CARE_PROVIDER_SITE_OTHER): Payer: BC Managed Care – PPO

## 2019-06-24 ENCOUNTER — Ambulatory Visit: Payer: BC Managed Care – PPO | Admitting: Women's Health

## 2019-06-24 ENCOUNTER — Encounter: Payer: Self-pay | Admitting: Women's Health

## 2019-06-24 VITALS — BP 118/78

## 2019-06-24 DIAGNOSIS — O09521 Supervision of elderly multigravida, first trimester: Secondary | ICD-10-CM

## 2019-06-24 DIAGNOSIS — O3680X1 Pregnancy with inconclusive fetal viability, fetus 1: Secondary | ICD-10-CM | POA: Diagnosis not present

## 2019-06-24 DIAGNOSIS — O3680X0 Pregnancy with inconclusive fetal viability, not applicable or unspecified: Secondary | ICD-10-CM

## 2019-06-24 DIAGNOSIS — N854 Malposition of uterus: Secondary | ICD-10-CM

## 2019-06-24 DIAGNOSIS — Z3A01 Less than 8 weeks gestation of pregnancy: Secondary | ICD-10-CM

## 2019-06-24 NOTE — Patient Instructions (Addendum)
Congratulations!!! First Trimester of Pregnancy The first trimester of pregnancy is from week 1 until the end of week 13 (months 1 through 3). A week after a sperm fertilizes an egg, the egg will implant on the wall of the uterus. This embryo will begin to develop into a baby. Genes from you and your partner will form the baby. The female genes will determine whether the baby will be a boy or a girl. At 6-8 weeks, the eyes and face will be formed, and the heartbeat can be seen on ultrasound. At the end of 12 weeks, all the baby's organs will be formed. Now that you are pregnant, you will want to do everything you can to have a healthy baby. Two of the most important things are to get good prenatal care and to follow your health care provider's instructions. Prenatal care is all the medical care you receive before the baby's birth. This care will help prevent, find, and treat any problems during the pregnancy and childbirth. Body changes during your first trimester Your body goes through many changes during pregnancy. The changes vary from woman to woman.  You may gain or lose a couple of pounds at first.  You may feel sick to your stomach (nauseous) and you may throw up (vomit). If the vomiting is uncontrollable, call your health care provider.  You may tire easily.  You may develop headaches that can be relieved by medicines. All medicines should be approved by your health care provider.  You may urinate more often. Painful urination may mean you have a bladder infection.  You may develop heartburn as a result of your pregnancy.  You may develop constipation because certain hormones are causing the muscles that push stool through your intestines to slow down.  You may develop hemorrhoids or swollen veins (varicose veins).  Your breasts may begin to grow larger and become tender. Your nipples may stick out more, and the tissue that surrounds them (areola) may become darker.  Your gums may  bleed and may be sensitive to brushing and flossing.  Dark spots or blotches (chloasma, mask of pregnancy) may develop on your face. This will likely fade after the baby is born.  Your menstrual periods will stop.  You may have a loss of appetite.  You may develop cravings for certain kinds of food.  You may have changes in your emotions from day to day, such as being excited to be pregnant or being concerned that something may go wrong with the pregnancy and baby.  You may have more vivid and strange dreams.  You may have changes in your hair. These can include thickening of your hair, rapid growth, and changes in texture. Some women also have hair loss during or after pregnancy, or hair that feels dry or thin. Your hair will most likely return to normal after your baby is born. What to expect at prenatal visits During a routine prenatal visit:  You will be weighed to make sure you and the baby are growing normally.  Your blood pressure will be taken.  Your abdomen will be measured to track your baby's growth.  The fetal heartbeat will be listened to between weeks 10 and 14 of your pregnancy.  Test results from any previous visits will be discussed. Your health care provider may ask you:  How you are feeling.  If you are feeling the baby move.  If you have had any abnormal symptoms, such as leaking fluid, bleeding, severe headaches, or  abdominal cramping.  If you are using any tobacco products, including cigarettes, chewing tobacco, and electronic cigarettes.  If you have any questions. Other tests that may be performed during your first trimester include:  Blood tests to find your blood type and to check for the presence of any previous infections. The tests will also be used to check for low iron levels (anemia) and protein on red blood cells (Rh antibodies). Depending on your risk factors, or if you previously had diabetes during pregnancy, you may have tests to check for  high blood sugar that affects pregnant women (gestational diabetes).  Urine tests to check for infections, diabetes, or protein in the urine.  An ultrasound to confirm the proper growth and development of the baby.  Fetal screens for spinal cord problems (spina bifida) and Down syndrome.  HIV (human immunodeficiency virus) testing. Routine prenatal testing includes screening for HIV, unless you choose not to have this test.  You may need other tests to make sure you and the baby are doing well. Follow these instructions at home: Medicines  Follow your health care provider's instructions regarding medicine use. Specific medicines may be either safe or unsafe to take during pregnancy.  Take a prenatal vitamin that contains at least 600 micrograms (mcg) of folic acid.  If you develop constipation, try taking a stool softener if your health care provider approves. Eating and drinking   Eat a balanced diet that includes fresh fruits and vegetables, whole grains, good sources of protein such as meat, eggs, or tofu, and low-fat dairy. Your health care provider will help you determine the amount of weight gain that is right for you.  Avoid raw meat and uncooked cheese. These carry germs that can cause birth defects in the baby.  Eating four or five small meals rather than three large meals a day may help relieve nausea and vomiting. If you start to feel nauseous, eating a few soda crackers can be helpful. Drinking liquids between meals, instead of during meals, also seems to help ease nausea and vomiting.  Limit foods that are high in fat and processed sugars, such as fried and sweet foods.  To prevent constipation: ? Eat foods that are high in fiber, such as fresh fruits and vegetables, whole grains, and beans. ? Drink enough fluid to keep your urine clear or pale yellow. Activity  Exercise only as directed by your health care provider. Most women can continue their usual exercise routine  during pregnancy. Try to exercise for 30 minutes at least 5 days a week. Exercising will help you: ? Control your weight. ? Stay in shape. ? Be prepared for labor and delivery.  Experiencing pain or cramping in the lower abdomen or lower back is a good sign that you should stop exercising. Check with your health care provider before continuing with normal exercises.  Try to avoid standing for long periods of time. Move your legs often if you must stand in one place for a long time.  Avoid heavy lifting.  Wear low-heeled shoes and practice good posture.  You may continue to have sex unless your health care provider tells you not to. Relieving pain and discomfort  Wear a good support bra to relieve breast tenderness.  Take warm sitz baths to soothe any pain or discomfort caused by hemorrhoids. Use hemorrhoid cream if your health care provider approves.  Rest with your legs elevated if you have leg cramps or low back pain.  If you develop varicose veins  in your legs, wear support hose. Elevate your feet for 15 minutes, 3-4 times a day. Limit salt in your diet. Prenatal care  Schedule your prenatal visits by the twelfth week of pregnancy. They are usually scheduled monthly at first, then more often in the last 2 months before delivery.  Write down your questions. Take them to your prenatal visits.  Keep all your prenatal visits as told by your health care provider. This is important. Safety  Wear your seat belt at all times when driving.  Make a list of emergency phone numbers, including numbers for family, friends, the hospital, and police and fire departments. General instructions  Ask your health care provider for a referral to a local prenatal education class. Begin classes no later than the beginning of month 6 of your pregnancy.  Ask for help if you have counseling or nutritional needs during pregnancy. Your health care provider can offer advice or refer you to specialists  for help with various needs.  Do not use hot tubs, steam rooms, or saunas.  Do not douche or use tampons or scented sanitary pads.  Do not cross your legs for long periods of time.  Avoid cat litter boxes and soil used by cats. These carry germs that can cause birth defects in the baby and possibly loss of the fetus by miscarriage or stillbirth.  Avoid all smoking, herbs, alcohol, and medicines not prescribed by your health care provider. Chemicals in these products affect the formation and growth of the baby.  Do not use any products that contain nicotine or tobacco, such as cigarettes and e-cigarettes. If you need help quitting, ask your health care provider. You may receive counseling support and other resources to help you quit.  Schedule a dentist appointment. At home, brush your teeth with a soft toothbrush and be gentle when you floss. Contact a health care provider if:  You have dizziness.  You have mild pelvic cramps, pelvic pressure, or nagging pain in the abdominal area.  You have persistent nausea, vomiting, or diarrhea.  You have a bad smelling vaginal discharge.  You have pain when you urinate.  You notice increased swelling in your face, hands, legs, or ankles.  You are exposed to fifth disease or chickenpox.  You are exposed to [place] measles (rubella) and have never had it. Get help right away if:  You have a fever.  You are leaking fluid from your vagina.  You have spotting or bleeding from your vagina.  You have severe abdominal cramping or pain.  You have rapid weight gain or loss.  You vomit blood or material that looks like coffee grounds.  You develop a severe headache.  You have shortness of breath.  You have any kind of trauma, such as from a fall or a car accident. Summary  The first trimester of pregnancy is from week 1 until the end of week 13 (months 1 through 3).  Your body goes through many changes during pregnancy. The changes  vary from woman to woman.  You will have routine prenatal visits. During those visits, your health care provider will examine you, discuss any test results you may have, and talk with you about how you are feeling. This information is not intended to replace advice given to you by your health care provider. Make sure you discuss any questions you have with your health care provider. Document Revised: 05/04/2017 Document Reviewed: 05/03/2016 Elsevier Patient Education  2020 [employer].

## 2019-06-24 NOTE — Progress Notes (Signed)
39 yo SBF G2P0 presents for viability/dating ultrasound. LMP 04/25/2019, light 1 1/2 day cycle, normal 4 day cycle in October 2020. Denies cramping, spotting, discharge, urinary symptoms, or fever. Reports recent back strain. Decreasing smoking and decreased , Prozac dose to 10mg .   Exam: appears well, happy with pregnancy. Ultrasound: anteverted uterus, single subserous fibroid posteriorly 51mm, single viable IUP, CRL consistent with 7 wks 4 days, appropriate growth since previous scan. EDD by U/S 02/06/2020. FHR: 141 bpm. Normal yolk sac. Cervix long and closed. Adnexa WNL- resolving corputeum on right side 1.8 cm. No free fluid.  EDC changed to 02/06/2020.  First trimester pregnancy 7 weeks 4 days by size, 8 weeks 4 days by LMP  Plan: Congratulations given, aware we no longer deliver copy of ultrasound given and will schedule new OB appointment.   Continue daily prenatal vitamins, decreasing smoking, and decreasing prozac to 10mg . Safe pregnancy behaviors discussed. Recommend otc tylenol for back pain.

## 2020-11-16 ENCOUNTER — Other Ambulatory Visit: Payer: Self-pay | Admitting: Obstetrics and Gynecology

## 2020-11-16 DIAGNOSIS — R928 Other abnormal and inconclusive findings on diagnostic imaging of breast: Secondary | ICD-10-CM

## 2020-11-19 ENCOUNTER — Other Ambulatory Visit: Payer: Self-pay

## 2020-11-19 ENCOUNTER — Ambulatory Visit
Admission: RE | Admit: 2020-11-19 | Discharge: 2020-11-19 | Disposition: A | Discharge status: Home / Self Care | Payer: BC Managed Care – PPO | Source: Ambulatory Visit | Attending: Obstetrics and Gynecology | Admitting: Obstetrics and Gynecology

## 2020-11-19 ENCOUNTER — Other Ambulatory Visit: Payer: Self-pay | Admitting: Obstetrics and Gynecology

## 2020-11-19 DIAGNOSIS — R928 Other abnormal and inconclusive findings on diagnostic imaging of breast: Secondary | ICD-10-CM

## 2021-05-23 ENCOUNTER — Other Ambulatory Visit: Payer: BC Managed Care – PPO

## 2021-06-07 ENCOUNTER — Ambulatory Visit
Admission: RE | Admit: 2021-06-07 | Discharge: 2021-06-07 | Disposition: A | Discharge status: Home / Self Care | Payer: BC Managed Care – PPO | Source: Ambulatory Visit | Attending: Obstetrics and Gynecology | Admitting: Obstetrics and Gynecology

## 2021-06-07 DIAGNOSIS — R928 Other abnormal and inconclusive findings on diagnostic imaging of breast: Secondary | ICD-10-CM

## 2023-03-20 IMAGING — US US BREAST*L* LIMITED INC AXILLA
1 series · 7 of 7 positions shown · non-contrast
Comparison: Previous exam(s).

CLINICAL DATA: 40-year-old female presenting for follow-up of a
likely benign left breast masses.

EXAM:
DIGITAL DIAGNOSTIC UNILATERAL LEFT MAMMOGRAM WITH TOMOSYNTHESIS AND
CAD; ULTRASOUND LEFT BREAST LIMITED
TECHNIQUE: Left digital diagnostic mammography and breast tomosynthesis was
performed. The images were evaluated with computer-aided detection.;
Targeted ultrasound examination of the left breast was performed.

[Series 1: us breast*left* limited inc axilla · 0.04mm/px · 7 of 7 slices shown]
[im 1/7]
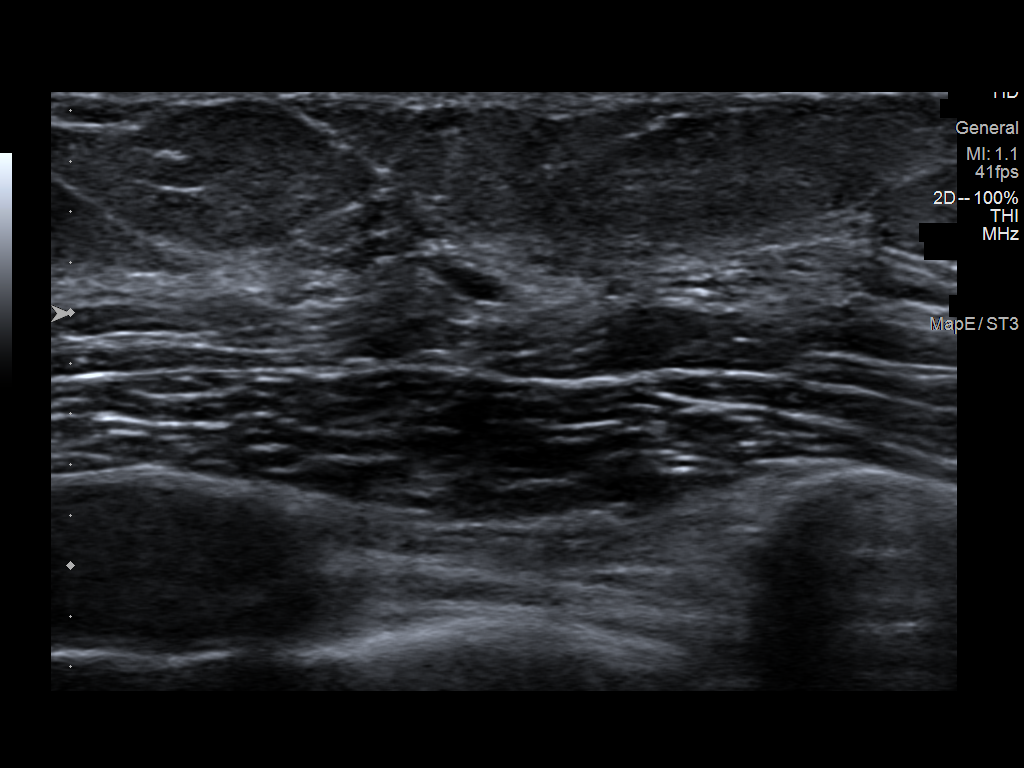
[im 2/7]
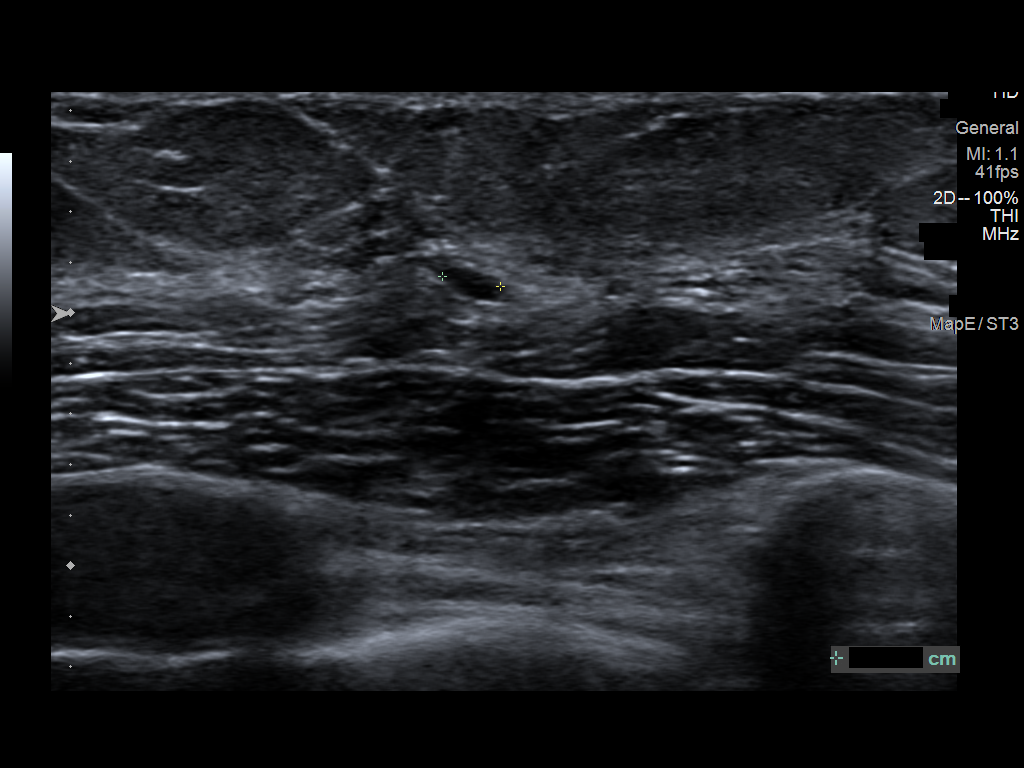
[im 3/7]
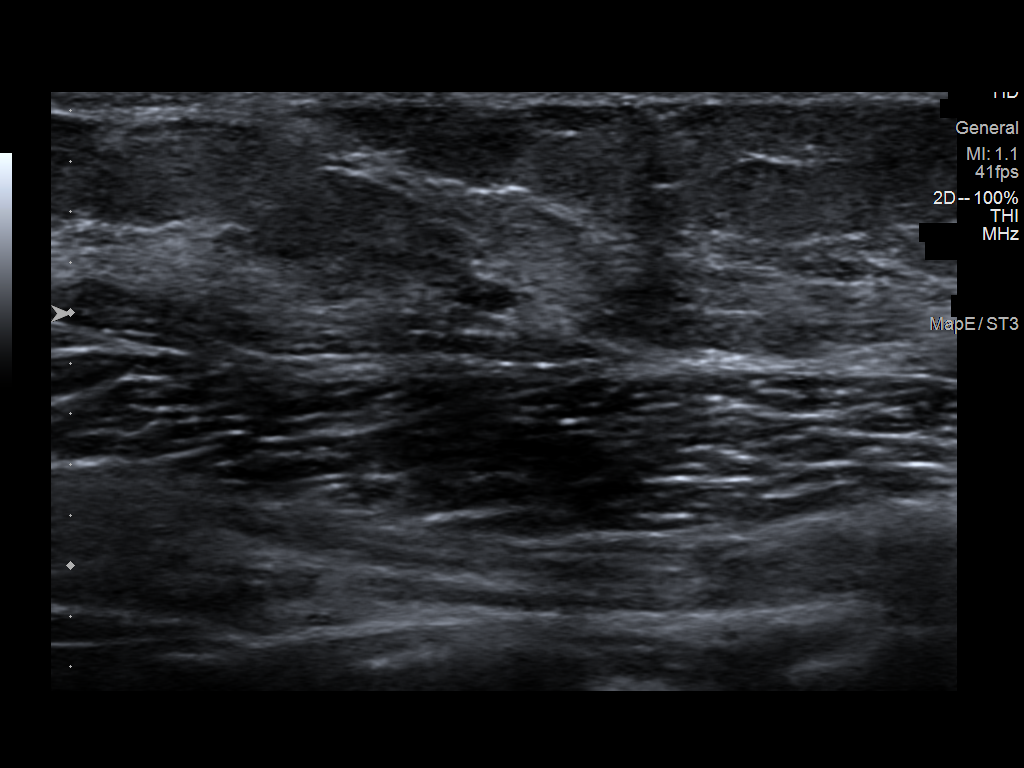
[im 4/7]
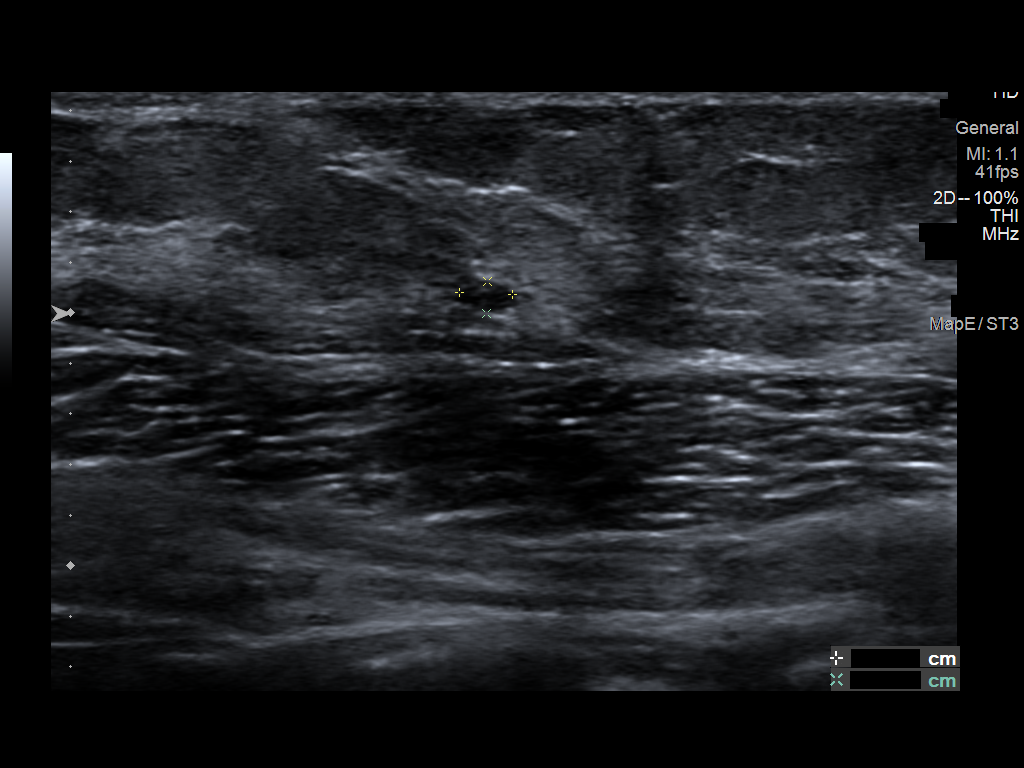
[im 5/7]
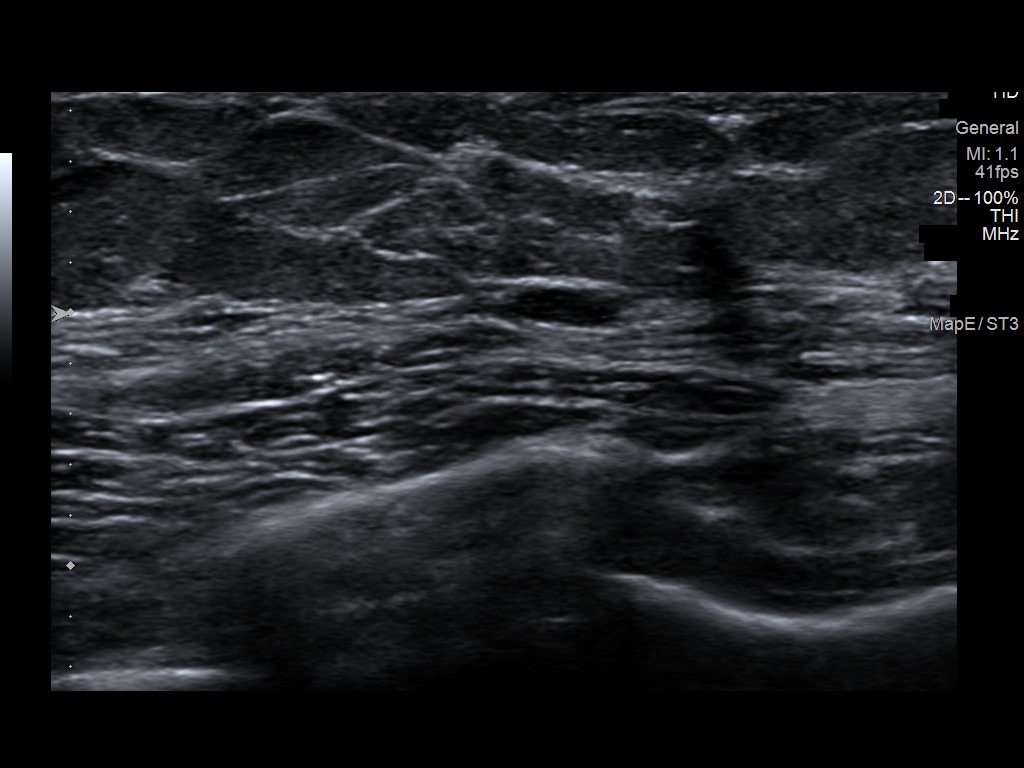
[im 6/7]
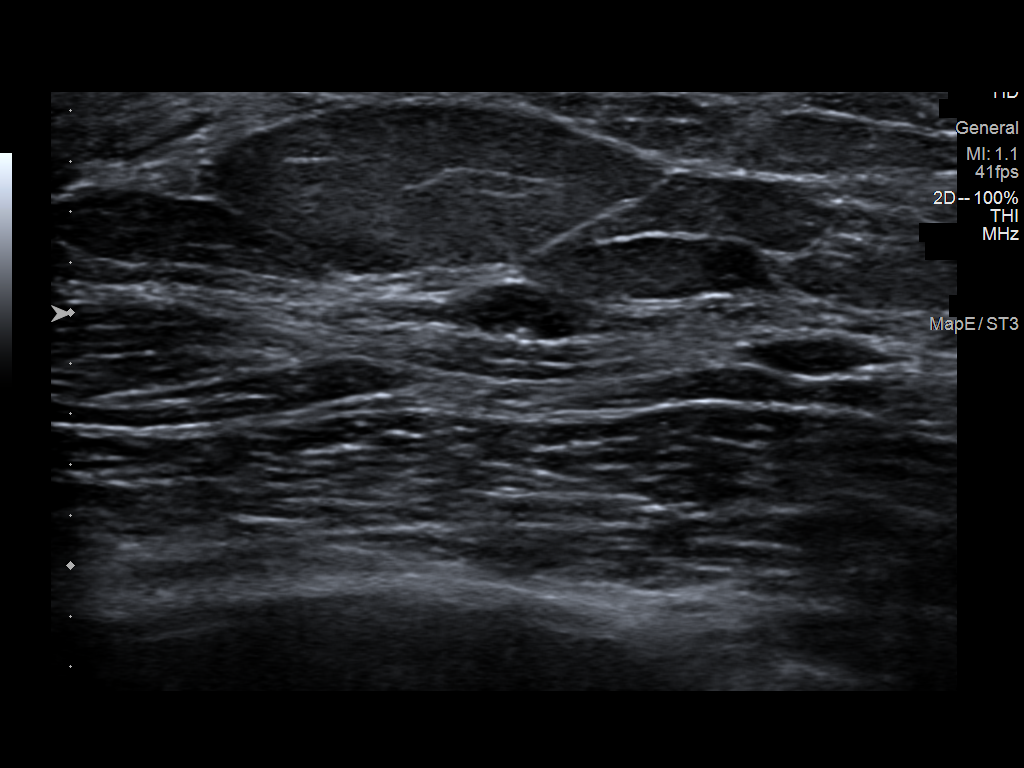
[im 7/7]
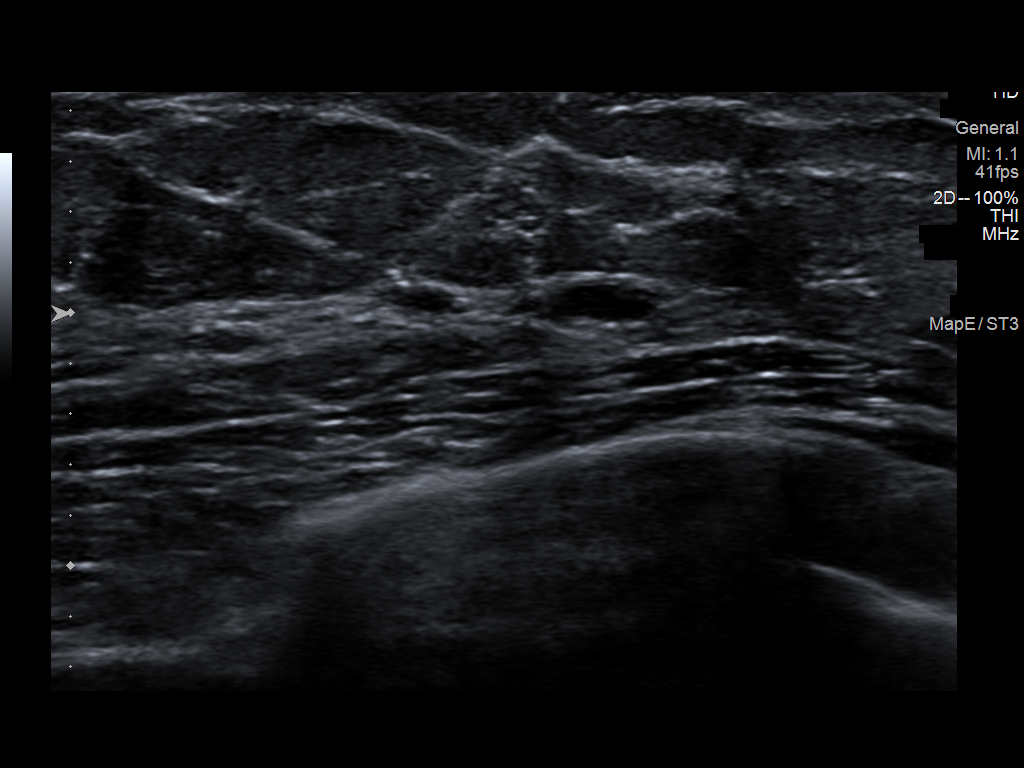

[7 of 7 positions shown; findings below may reference images not displayed]

ACR Breast Density Category c: The breast tissue is heterogeneously
dense, which may obscure small masses.
FINDINGS: Two small adjacent masses are again seen in the lower outer quadrant
of the left breast. No new suspicious calcifications, masses or
areas of distortion are seen in the left breast.

Ultrasound targeted to the inferior to lower outer left breast
demonstrates numerous subcentimeter anechoic oval circumscribed
masses consistent with benign cysts. The specific mass seen in the
left breast on the prior ultrasound at 6 o'clock, 1 cm from the
nipple is not visualized.
IMPRESSION: Multiple benign cysts are found in the inferior left breast.

RECOMMENDATION:
Return to routine screening mammography is recommended. The patient
will be due for screening in Wednesday November, 2021.

I have discussed the findings and recommendations with the patient.
If applicable, a reminder letter will be sent to the patient
regarding the next appointment.

BI-RADS CATEGORY  2: Benign.
